# Patient Record
Sex: Female | Born: 1955 | Race: Black or African American | Hispanic: No | Marital: Single | Smoking: Never smoker
Health system: Southern US, Community
[De-identification: ages and names within clinical notes are randomized; demographics above are authoritative.]

## PROBLEM LIST (undated history)

## (undated) DIAGNOSIS — H3320 Serous retinal detachment, unspecified eye: Secondary | ICD-10-CM

## (undated) DIAGNOSIS — H547 Unspecified visual loss: Secondary | ICD-10-CM

## (undated) DIAGNOSIS — K529 Noninfective gastroenteritis and colitis, unspecified: Secondary | ICD-10-CM

## (undated) DIAGNOSIS — I10 Essential (primary) hypertension: Secondary | ICD-10-CM

## (undated) DIAGNOSIS — D649 Anemia, unspecified: Secondary | ICD-10-CM

## (undated) HISTORY — PX: ABDOMINAL HYSTERECTOMY: SHX81

## (undated) HISTORY — PX: EYE SURGERY: SHX253

---

## 2003-09-17 ENCOUNTER — Other Ambulatory Visit: Payer: Self-pay

## 2004-01-30 ENCOUNTER — Ambulatory Visit: Payer: Self-pay | Admitting: Internal Medicine

## 2004-12-23 ENCOUNTER — Emergency Department: Payer: Self-pay | Admitting: Emergency Medicine

## 2005-05-08 ENCOUNTER — Ambulatory Visit: Payer: Self-pay | Admitting: Gastroenterology

## 2005-06-10 ENCOUNTER — Ambulatory Visit: Payer: Self-pay | Admitting: Internal Medicine

## 2006-10-03 ENCOUNTER — Inpatient Hospital Stay: Payer: Self-pay | Admitting: Internal Medicine

## 2006-11-05 ENCOUNTER — Ambulatory Visit: Payer: Self-pay | Admitting: Obstetrics and Gynecology

## 2006-11-12 ENCOUNTER — Inpatient Hospital Stay: Payer: Self-pay | Admitting: Obstetrics and Gynecology

## 2007-01-11 ENCOUNTER — Ambulatory Visit: Payer: Self-pay | Admitting: Urology

## 2007-02-12 ENCOUNTER — Ambulatory Visit: Payer: Self-pay | Admitting: Urology

## 2007-08-11 ENCOUNTER — Ambulatory Visit: Payer: Self-pay | Admitting: Internal Medicine

## 2008-07-12 ENCOUNTER — Ambulatory Visit: Payer: Self-pay | Admitting: Internal Medicine

## 2009-08-02 ENCOUNTER — Ambulatory Visit: Payer: Self-pay | Admitting: Internal Medicine

## 2010-03-04 ENCOUNTER — Observation Stay: Payer: Self-pay | Admitting: Internal Medicine

## 2010-11-14 ENCOUNTER — Emergency Department (HOSPITAL_COMMUNITY)
Admission: EM | Admit: 2010-11-14 | Discharge: 2010-11-14 | Disposition: A | Discharge status: Home / Self Care | Payer: Medicare Other | Attending: Emergency Medicine | Admitting: Emergency Medicine

## 2010-11-14 DIAGNOSIS — R002 Palpitations: Secondary | ICD-10-CM | POA: Insufficient documentation

## 2010-11-14 DIAGNOSIS — K625 Hemorrhage of anus and rectum: Secondary | ICD-10-CM | POA: Insufficient documentation

## 2010-11-14 DIAGNOSIS — R000 Tachycardia, unspecified: Secondary | ICD-10-CM | POA: Insufficient documentation

## 2010-11-14 DIAGNOSIS — I10 Essential (primary) hypertension: Secondary | ICD-10-CM | POA: Insufficient documentation

## 2010-11-14 DIAGNOSIS — R209 Unspecified disturbances of skin sensation: Secondary | ICD-10-CM | POA: Insufficient documentation

## 2010-11-14 DIAGNOSIS — Z79899 Other long term (current) drug therapy: Secondary | ICD-10-CM | POA: Insufficient documentation

## 2010-11-14 LAB — CBC
HCT: 39 % (ref 36.0–46.0)
Hemoglobin: 12.7 g/dL (ref 12.0–15.0)
RBC: 4.9 MIL/uL (ref 3.87–5.11)
RDW: 14 % (ref 11.5–15.5)
WBC: 7.4 10*3/uL (ref 4.0–10.5)

## 2010-11-14 LAB — BASIC METABOLIC PANEL
BUN: 14 mg/dL (ref 6–23)
CO2: 26 mEq/L (ref 19–32)
Chloride: 108 mEq/L (ref 96–112)
GFR calc Af Amer: >60 mL/min (ref >60)
Potassium: 3.9 mEq/L (ref 3.5–5.1)

## 2010-11-14 LAB — OCCULT BLOOD, POC DEVICE: Fecal Occult Bld: NEGATIVE

## 2011-06-04 ENCOUNTER — Ambulatory Visit: Payer: Self-pay | Admitting: Internal Medicine

## 2011-07-02 DIAGNOSIS — E876 Hypokalemia: Secondary | ICD-10-CM | POA: Insufficient documentation

## 2011-07-02 DIAGNOSIS — R079 Chest pain, unspecified: Secondary | ICD-10-CM | POA: Insufficient documentation

## 2011-12-06 ENCOUNTER — Emergency Department: Payer: Self-pay | Admitting: Emergency Medicine

## 2012-05-20 DIAGNOSIS — H9201 Otalgia, right ear: Secondary | ICD-10-CM | POA: Insufficient documentation

## 2014-05-31 ENCOUNTER — Ambulatory Visit: Payer: Self-pay | Admitting: Gastroenterology

## 2014-06-03 ENCOUNTER — Emergency Department: Payer: Self-pay | Admitting: Emergency Medicine

## 2014-07-24 LAB — SURGICAL PATHOLOGY

## 2014-08-31 ENCOUNTER — Other Ambulatory Visit: Payer: Self-pay | Admitting: Occupational Medicine

## 2014-08-31 ENCOUNTER — Ambulatory Visit: Payer: Self-pay

## 2014-08-31 DIAGNOSIS — M25511 Pain in right shoulder: Secondary | ICD-10-CM

## 2014-08-31 DIAGNOSIS — M79601 Pain in right arm: Secondary | ICD-10-CM

## 2014-09-15 DIAGNOSIS — K51911 Ulcerative colitis, unspecified with rectal bleeding: Secondary | ICD-10-CM | POA: Insufficient documentation

## 2014-09-15 DIAGNOSIS — H5440 Blindness, one eye, unspecified eye: Secondary | ICD-10-CM | POA: Insufficient documentation

## 2014-09-15 DIAGNOSIS — I10 Essential (primary) hypertension: Secondary | ICD-10-CM | POA: Insufficient documentation

## 2014-09-23 DIAGNOSIS — D500 Iron deficiency anemia secondary to blood loss (chronic): Secondary | ICD-10-CM | POA: Insufficient documentation

## 2014-10-01 ENCOUNTER — Encounter: Payer: Self-pay | Admitting: Emergency Medicine

## 2014-10-01 ENCOUNTER — Emergency Department
Admission: EM | Admit: 2014-10-01 | Discharge: 2014-10-01 | Disposition: A | Discharge status: Home / Self Care | Payer: PPO | Attending: Emergency Medicine | Admitting: Emergency Medicine

## 2014-10-01 ENCOUNTER — Emergency Department: Payer: PPO

## 2014-10-01 DIAGNOSIS — S40011A Contusion of right shoulder, initial encounter: Secondary | ICD-10-CM | POA: Insufficient documentation

## 2014-10-01 DIAGNOSIS — H547 Unspecified visual loss: Secondary | ICD-10-CM | POA: Diagnosis not present

## 2014-10-01 DIAGNOSIS — Y9389 Activity, other specified: Secondary | ICD-10-CM | POA: Insufficient documentation

## 2014-10-01 DIAGNOSIS — Y92410 Unspecified street and highway as the place of occurrence of the external cause: Secondary | ICD-10-CM | POA: Insufficient documentation

## 2014-10-01 DIAGNOSIS — S4991XA Unspecified injury of right shoulder and upper arm, initial encounter: Secondary | ICD-10-CM | POA: Diagnosis present

## 2014-10-01 DIAGNOSIS — I10 Essential (primary) hypertension: Secondary | ICD-10-CM | POA: Insufficient documentation

## 2014-10-01 DIAGNOSIS — Y998 Other external cause status: Secondary | ICD-10-CM | POA: Insufficient documentation

## 2014-10-01 HISTORY — DX: Essential (primary) hypertension: I10

## 2014-10-01 HISTORY — DX: Noninfective gastroenteritis and colitis, unspecified: K52.9

## 2014-10-01 HISTORY — DX: Unspecified visual loss: H54.7

## 2014-10-01 HISTORY — DX: Serous retinal detachment, unspecified eye: H33.20

## 2014-10-01 IMAGING — CR DG SHOULDER 2+V*R*
1 series · 3 of 3 positions shown · non-contrast
Comparison: None.

CLINICAL DATA: Status post fall yesterday with right shoulder pain.

EXAM:
RIGHT SHOULDER - 2+ VIEW

[Series 1: dg shoulder right · 0.14mm/px · 3 of 3 slices shown]
[im 1/3]
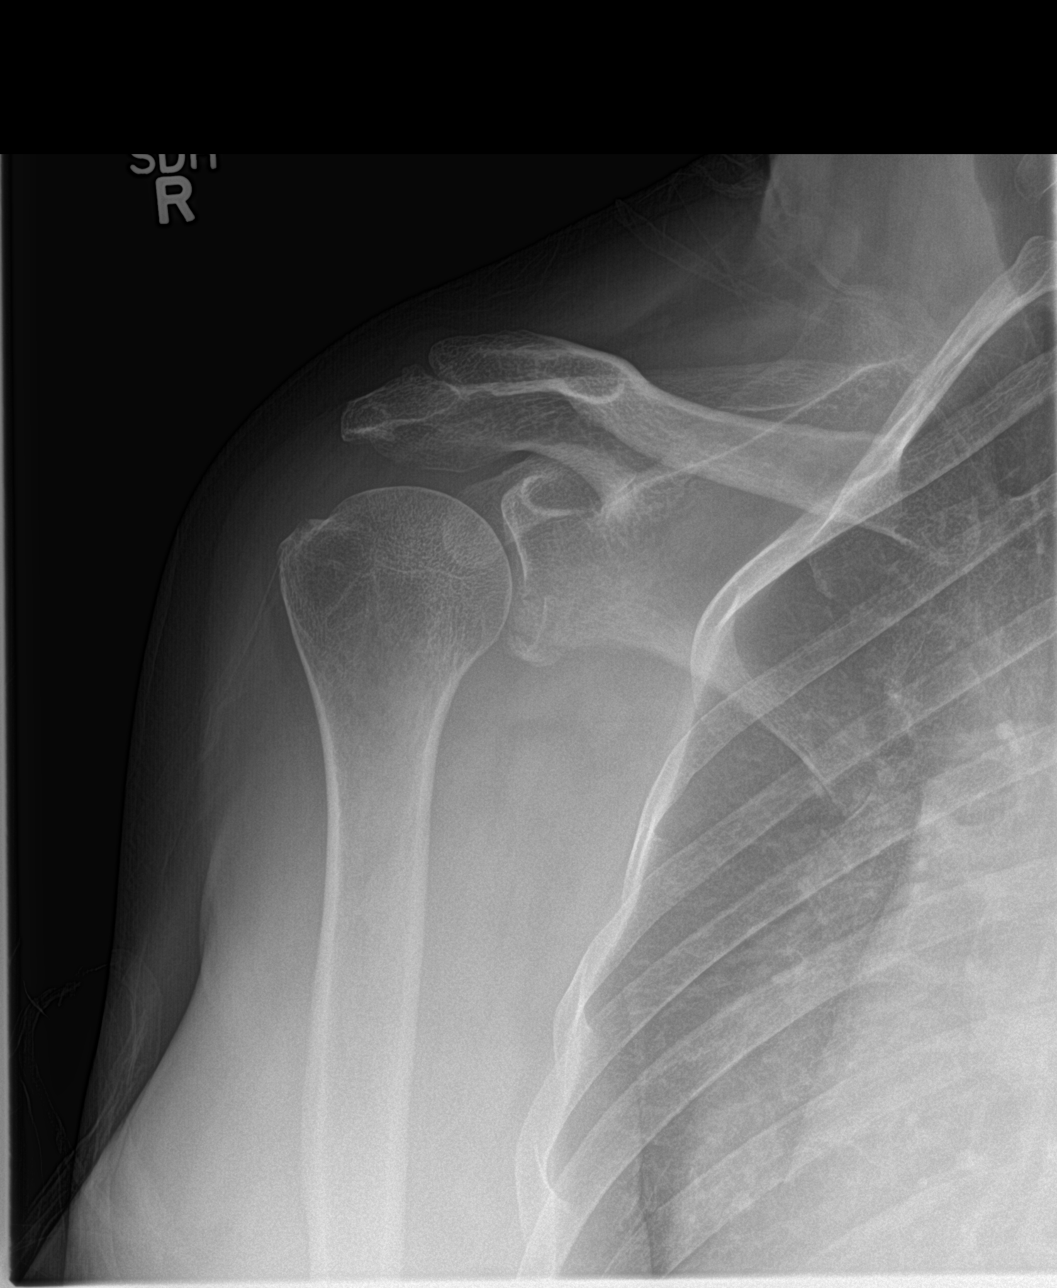
[im 2/3]
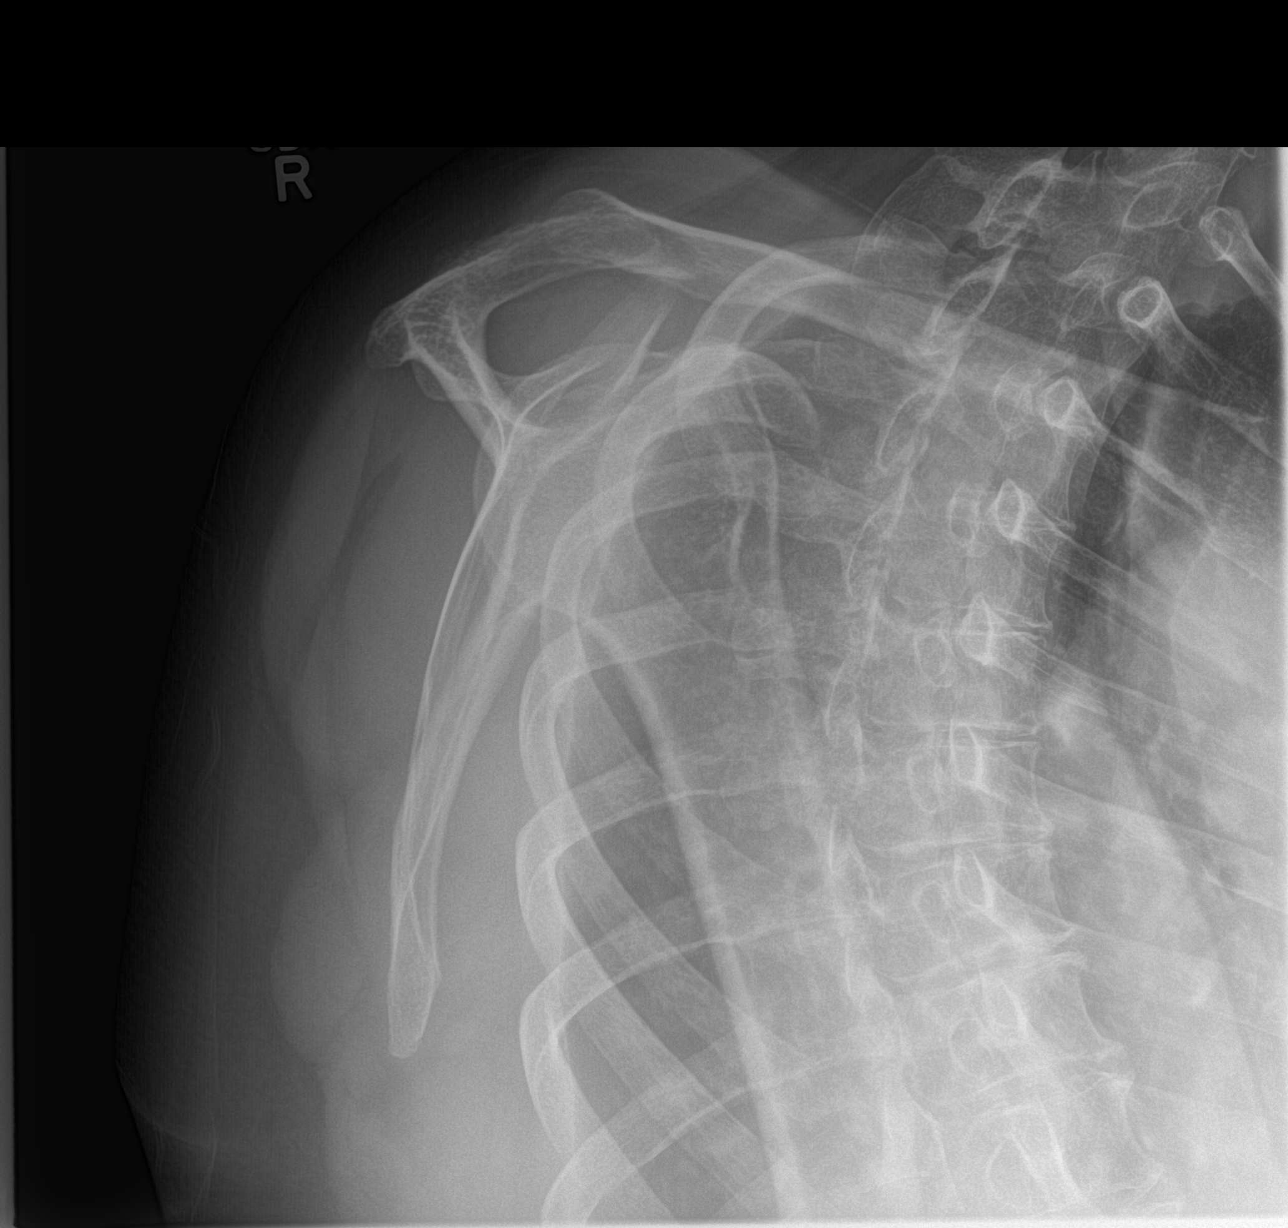
[im 3/3]
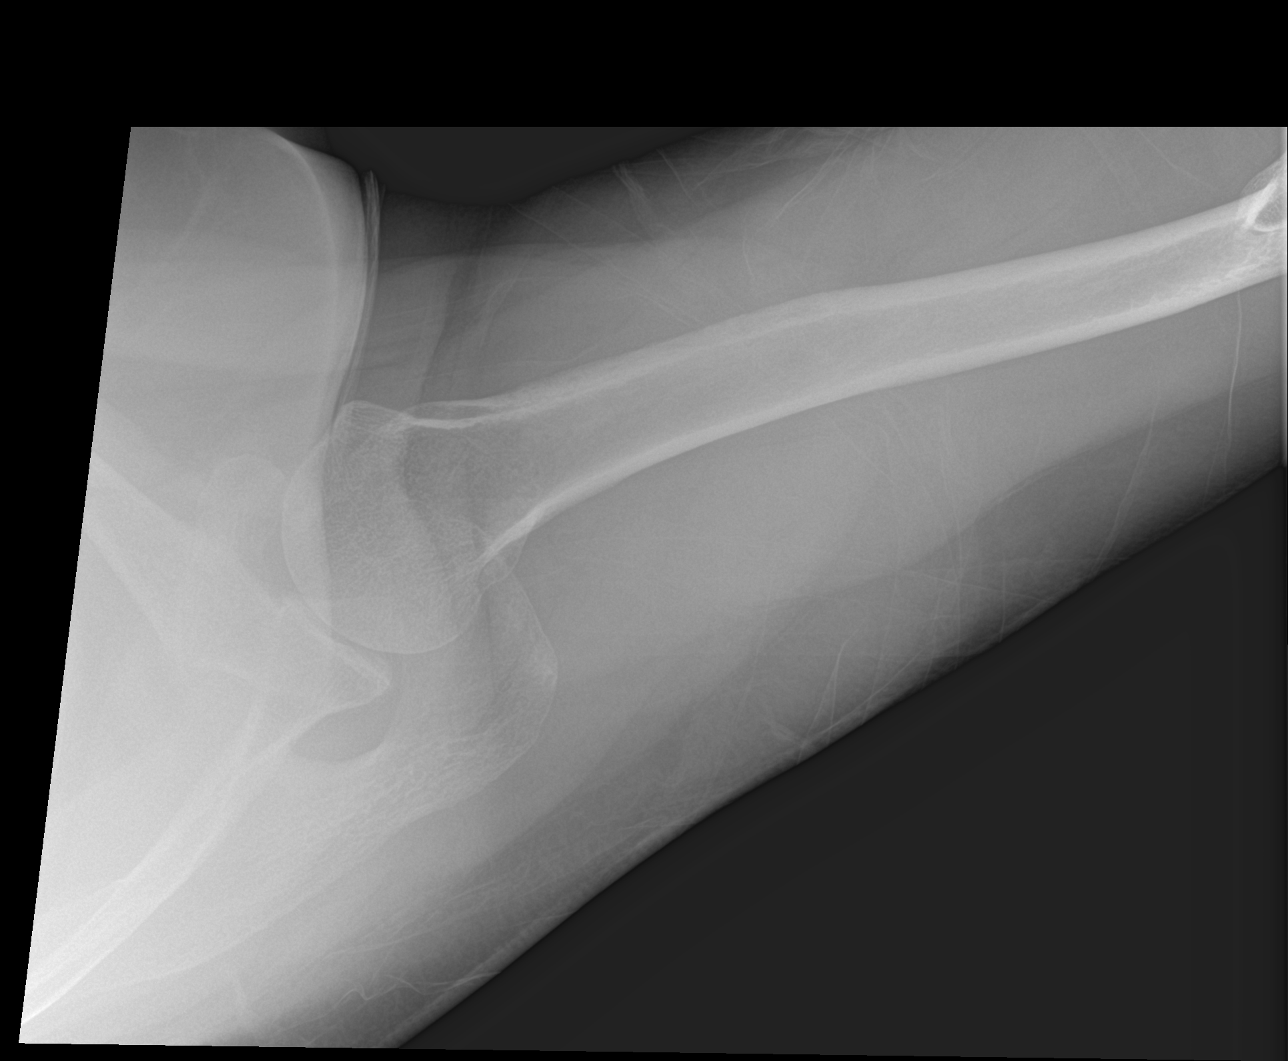

[3 of 3 positions shown; findings below may reference images not displayed]

FINDINGS: There is no evidence of fracture or dislocation. There is no
evidence of arthropathy or other focal bone abnormality. Soft
tissues are unremarkable.
IMPRESSION: Negative.

## 2014-10-01 MED ORDER — HYDROCODONE-ACETAMINOPHEN 5-325 MG PO TABS
1.0000 | ORAL_TABLET | Freq: Once | ORAL | Status: AC
  Administered 2014-10-01: 1 via ORAL

## 2014-10-01 MED ORDER — HYDROCODONE-ACETAMINOPHEN 5-325 MG PO TABS: 1.0000 | ORAL_TABLET | ORAL | Status: DC | PRN

## 2014-10-01 MED ORDER — HYDROCODONE-ACETAMINOPHEN 5-325 MG PO TABS
ORAL_TABLET | ORAL | Status: AC
  Administered 2014-10-01: 1 via ORAL
  Filled 2014-10-01: qty 1

## 2014-10-01 NOTE — Discharge Instructions (Signed)
Contusion A contusion is a deep bruise. Contusions happen when an injury causes bleeding under the skin. Signs of bruising include pain, puffiness (swelling), and discolored skin. The contusion may turn blue, purple, or yellow. HOME CARE   Put ice on the injured area.  Put ice in a plastic bag.  Place a towel between your skin and the bag.  Leave the ice on for 15-20 minutes, 03-04 times a day.  Only take medicine as told by your doctor.  Rest the injured area.  If possible, raise (elevate) the injured area to lessen puffiness. GET HELP RIGHT AWAY IF:   You have more bruising or puffiness.  You have pain that is getting worse.  Your puffiness or pain is not helped by medicine. MAKE SURE YOU:   Understand these instructions.  Will watch your condition.  Will get help right away if you are not doing well or get worse. Document Released: 09/03/2007 Document Revised: 06/09/2011 Document Reviewed: 01/20/2011 [facility] Patient Information 2015 [place], [place]. This information is not intended to replace advice given to you by your health care provider. Make sure you discuss any questions you have with your health care provider.   WEAR SLING WHEN UP WALKING NORCO FOR PAIN AS DIRECTED

## 2014-10-01 NOTE — ED Notes (Addendum)
Pt reports she fell getting out of the [place] yesterday. Pt states she is visually impaired and her caregiver states she stepped down wrong. Pain to upper right shoulder. Increased pain to shoulder with movement.

## 2014-10-01 NOTE — ED Provider Notes (Signed)
[facility] Emergency Department Provider Note  ____________________________________________  Time seen: 1647  I have reviewed the triage vital signs and the nursing notes.   HISTORY  Chief Complaint Shoulder Injury   HPI Tiffany Rosales is a 59 y.o. female is here with complaint of right shoulder pain. Patient states that she fell out of the [place] yesterday because she is visually impaired and stepped down wrong. There is increased pain with movement of the shoulder however patient was able to get dressed this morning by herself. She denies any previous problems with her shoulder. Currently patient's pain is moderate being between 5 and 6 out of 10. Pain has been constant and she fell. Holding it still makes it feel better, movement makes it worse.   Past Medical History  Diagnosis Date  . Vision impairment   . Colitis   . Hypertension   . Detached retina     There are no active problems to display for this patient.   Past Surgical History  Procedure Laterality Date  . Abdominal hysterectomy      Current Outpatient Rx  Name  Route  Sig  Dispense  Refill  . HYDROcodone-acetaminophen (NORCO/VICODIN) 5-325 MG per tablet   Oral   Take 1 tablet by mouth every 4 (four) hours as needed for moderate pain.   20 tablet   0     Allergies Review of patient's allergies indicates no known allergies.  No family history on file.  Social History History  Substance Use Topics  . Smoking status: Never Smoker   . Smokeless tobacco: Not on file  . Alcohol Use: No    Review of Systems Constitutional: No fever/chills Eyes: No acute visual changes. Patient is visually impaired ENT: No sore throat. Cardiovascular: Denies chest pain. Respiratory: Denies shortness of breath. Gastrointestinal: No abdominal pain.  No nausea, no vomiting.  Genitourinary: Negative for dysuria. Musculoskeletal: Negative for back pain. Skin: Negative for  rash. Neurological: Negative for headaches, focal weakness or numbness.  10-point ROS otherwise negative.  ____________________________________________   PHYSICAL EXAM:  VITAL SIGNS: ED Triage Vitals  Enc Vitals Group     BP 10/01/14 1555 134/84 mmHg     Pulse Rate 10/01/14 1555 106     Resp 10/01/14 1555 18     Temp 10/01/14 1555 97.4 F (36.3 C)     Temp Source 10/01/14 1555 Oral     SpO2 10/01/14 1555 99 %     Weight 10/01/14 1555 250 lb (113.399 kg)     Height 10/01/14 1555 5' 7"  (1.702 m)     Head Cir --      Peak Flow --      Pain Score --      Pain Loc --      Pain Edu? --      Excl. in [place]? --     Constitutional: Alert and oriented. Well appearing and in no acute distress. Eyes: Conjunctivae are normal. Head: Atraumatic. Nose: No congestion/rhinnorhea. Neck: No stridor.   Cardiovascular: Normal rate, regular rhythm. Grossly normal heart sounds.  Good peripheral circulation. Respiratory: Normal respiratory effort.  No retractions. Lungs CTAB. Gastrointestinal: Soft and nontender. No distention. Musculoskeletal: Right shoulder no gross deformity. Range of motion is restricted secondary to discomfort. No crepitus was noted. No abrasion or ecchymosis noted in the area. Motor sensory function intact   No lower extremity tenderness nor edema.  No joint effusions. Neurologic:  Normal speech and language. No gross focal neurologic deficits  are appreciated. Speech is normal. No gait instability. Skin:  Skin is warm, dry. See shoulder exam for skin. Psychiatric: Mood and affect are normal. Speech and behavior are normal.  ____________________________________________   LABS (all labs ordered are listed, but only abnormal results are displayed)  Labs Reviewed - No data to display ____________________________________________  EKG  Deferred ____________________________________________  RADIOLOGY  No evidence of fracture or dislocation per radiologist and reviewed by  me I, Johnn Hai, personally viewed and evaluated these images as part of my medical decision making.  ____________________________________________   PROCEDURES  Procedure(s) performed: None  Critical Care performed: No  ____________________________________________   INITIAL IMPRESSION / ASSESSMENT AND PLAN / ED COURSE  Pertinent labs & imaging results that were available during my care of the patient were reviewed by me and considered in my medical decision making (see chart for details  patient agrees to wear a sling to help support her shoulder for a couple days. She is also given a prescription for Norco for pain. She is to follow-up with Dr.  Mack Guise if any continued problems.   FINAL CLINICAL IMPRESSION(S) / ED DIAGNOSES  Final diagnoses:  Contusion of right shoulder, initial encounter      Johnn Hai, PA-C 10/01/14 [place], MD 10/02/14 [phone]

## 2014-10-03 ENCOUNTER — Ambulatory Visit: Payer: Self-pay | Admitting: Family Medicine

## 2015-02-06 ENCOUNTER — Encounter
Admission: RE | Admit: 2015-02-06 | Discharge: 2015-02-06 | Disposition: A | Discharge status: Home / Self Care | Payer: PPO | Source: Ambulatory Visit | Attending: Gastroenterology | Admitting: Gastroenterology

## 2015-02-06 DIAGNOSIS — K5090 Crohn's disease, unspecified, without complications: Secondary | ICD-10-CM | POA: Diagnosis not present

## 2015-02-06 LAB — CBC
HCT: 37.4 % (ref 35.0–47.0)
Hemoglobin: 11.3 g/dL — ABNORMAL LOW (ref 12.0–16.0)
MCH: 23.2 pg — AB (ref 26.0–34.0)
MCHC: 30.3 g/dL — ABNORMAL LOW (ref 32.0–36.0)
MCV: 76.5 fL — AB (ref 80.0–100.0)
PLATELETS: 195 10*3/uL (ref 150–440)
RBC: 4.89 MIL/uL (ref 3.80–5.20)
RDW: 18.1 % — AB (ref 11.5–14.5)
WBC: 9.3 10*3/uL (ref 3.6–11.0)

## 2015-02-06 LAB — ALT: ALT: 26 U/L (ref 14–54)

## 2015-03-06 ENCOUNTER — Encounter
Admission: RE | Admit: 2015-03-06 | Discharge: 2015-03-06 | Disposition: A | Discharge status: Home / Self Care | Payer: PPO | Source: Ambulatory Visit | Attending: Family Medicine | Admitting: Family Medicine

## 2015-03-06 DIAGNOSIS — K5090 Crohn's disease, unspecified, without complications: Secondary | ICD-10-CM | POA: Insufficient documentation

## 2015-03-06 LAB — CBC
HCT: 36.7 % (ref 35.0–47.0)
HEMOGLOBIN: 11.6 g/dL — AB (ref 12.0–16.0)
MCH: 25.7 pg — AB (ref 26.0–34.0)
MCHC: 31.5 g/dL — AB (ref 32.0–36.0)
MCV: 81.3 fL (ref 80.0–100.0)
PLATELETS: 266 10*3/uL (ref 150–440)
RBC: 4.51 MIL/uL (ref 3.80–5.20)
RDW: 18.8 % — AB (ref 11.5–14.5)
WBC: 5.4 10*3/uL (ref 3.6–11.0)

## 2015-03-06 LAB — ALT: ALT: 45 U/L (ref 14–54)

## 2015-04-17 DIAGNOSIS — Z9181 History of falling: Secondary | ICD-10-CM | POA: Insufficient documentation

## 2015-05-01 ENCOUNTER — Other Ambulatory Visit
Admission: RE | Admit: 2015-05-01 | Discharge: 2015-05-01 | Disposition: A | Discharge status: Home / Self Care | Payer: Medicare Other | Source: Ambulatory Visit | Attending: Gastroenterology | Admitting: Gastroenterology

## 2015-05-01 DIAGNOSIS — K51911 Ulcerative colitis, unspecified with rectal bleeding: Secondary | ICD-10-CM | POA: Insufficient documentation

## 2015-05-01 LAB — CBC
HEMATOCRIT: 38.8 % (ref 35.0–47.0)
Hemoglobin: 12.3 g/dL (ref 12.0–16.0)
MCH: 26.5 pg (ref 26.0–34.0)
MCHC: 31.8 g/dL — AB (ref 32.0–36.0)
MCV: 83.6 fL (ref 80.0–100.0)
Platelets: 177 10*3/uL (ref 150–440)
RBC: 4.64 MIL/uL (ref 3.80–5.20)
RDW: 19.5 % — AB (ref 11.5–14.5)
WBC: 13.3 10*3/uL — ABNORMAL HIGH (ref 3.6–11.0)

## 2015-05-01 LAB — ALT: ALT: 27 U/L (ref 14–54)

## 2015-05-15 ENCOUNTER — Other Ambulatory Visit
Admission: RE | Admit: 2015-05-15 | Discharge: 2015-05-15 | Disposition: A | Discharge status: Home / Self Care | Payer: Medicare Other | Source: Ambulatory Visit | Attending: Gastroenterology | Admitting: Gastroenterology

## 2015-05-15 DIAGNOSIS — K5090 Crohn's disease, unspecified, without complications: Secondary | ICD-10-CM | POA: Insufficient documentation

## 2015-05-15 LAB — CBC
HEMATOCRIT: 37.9 % (ref 35.0–47.0)
HEMOGLOBIN: 12.1 g/dL (ref 12.0–16.0)
MCH: 26.8 pg (ref 26.0–34.0)
MCHC: 31.8 g/dL — ABNORMAL LOW (ref 32.0–36.0)
MCV: 84.1 fL (ref 80.0–100.0)
Platelets: 144 10*3/uL — ABNORMAL LOW (ref 150–440)
RBC: 4.5 MIL/uL (ref 3.80–5.20)
RDW: 18 % — ABNORMAL HIGH (ref 11.5–14.5)
WBC: 6.8 10*3/uL (ref 3.6–11.0)

## 2015-05-15 LAB — ALT: ALT: 20 U/L (ref 14–54)

## 2015-06-16 ENCOUNTER — Emergency Department: Payer: Medicare Other

## 2015-06-16 ENCOUNTER — Emergency Department
Admission: EM | Admit: 2015-06-16 | Discharge: 2015-06-16 | Disposition: A | Discharge status: Home / Self Care | Payer: Medicare Other | Attending: Emergency Medicine | Admitting: Emergency Medicine

## 2015-06-16 DIAGNOSIS — I480 Paroxysmal atrial fibrillation: Secondary | ICD-10-CM

## 2015-06-16 DIAGNOSIS — I4891 Unspecified atrial fibrillation: Secondary | ICD-10-CM | POA: Insufficient documentation

## 2015-06-16 DIAGNOSIS — N390 Urinary tract infection, site not specified: Secondary | ICD-10-CM | POA: Diagnosis not present

## 2015-06-16 DIAGNOSIS — I10 Essential (primary) hypertension: Secondary | ICD-10-CM | POA: Insufficient documentation

## 2015-06-16 DIAGNOSIS — R42 Dizziness and giddiness: Secondary | ICD-10-CM

## 2015-06-16 LAB — URINALYSIS COMPLETE WITH MICROSCOPIC (ARMC ONLY)
Bilirubin Urine: NEGATIVE
GLUCOSE, UA: NEGATIVE mg/dL
Hgb urine dipstick: NEGATIVE
KETONES UR: NEGATIVE mg/dL
Nitrite: NEGATIVE
PROTEIN: NEGATIVE mg/dL
Specific Gravity, Urine: 1.016 (ref 1.005–1.030)
pH: 7 (ref 5.0–8.0)

## 2015-06-16 LAB — BASIC METABOLIC PANEL
Anion gap: 3 — ABNORMAL LOW (ref 5–15)
BUN: 12 mg/dL (ref 6–20)
CALCIUM: 10.3 mg/dL (ref 8.9–10.3)
CO2: 26 mmol/L (ref 22–32)
CREATININE: 0.75 mg/dL (ref 0.44–1.00)
Chloride: 109 mmol/L (ref 101–111)
GFR calc Af Amer: >60 mL/min (ref >60)
GLUCOSE: 73 mg/dL (ref 65–99)
POTASSIUM: 3.9 mmol/L (ref 3.5–5.1)
SODIUM: 138 mmol/L (ref 135–145)

## 2015-06-16 LAB — CBC
HEMATOCRIT: 36.2 % (ref 35.0–47.0)
Hemoglobin: 11.7 g/dL — ABNORMAL LOW (ref 12.0–16.0)
MCH: 27.5 pg (ref 26.0–34.0)
MCHC: 32.3 g/dL (ref 32.0–36.0)
MCV: 85.4 fL (ref 80.0–100.0)
PLATELETS: 172 10*3/uL (ref 150–440)
RBC: 4.24 MIL/uL (ref 3.80–5.20)
RDW: 15.2 % — AB (ref 11.5–14.5)
WBC: 5.6 10*3/uL (ref 3.6–11.0)

## 2015-06-16 LAB — TROPONIN I: Troponin I: <0.03 ng/mL (ref <0.031)

## 2015-06-16 MED ORDER — CEPHALEXIN 500 MG PO CAPS: 500.0000 mg | ORAL_CAPSULE | Freq: Two times a day (BID) | ORAL | Status: DC

## 2015-06-16 MED ORDER — DEXTROSE 5 % IV SOLN
1.0000 g | Freq: Once | INTRAVENOUS | Status: AC
  Administered 2015-06-16: 1 g via INTRAVENOUS

## 2015-06-16 MED ORDER — SODIUM CHLORIDE 0.9 % IV BOLUS (SEPSIS)
1000.0000 mL | Freq: Once | INTRAVENOUS | Status: AC
  Administered 2015-06-16: 1000 mL via INTRAVENOUS

## 2015-06-16 MED ORDER — DEXTROSE 5 % IV SOLN
INTRAVENOUS | Status: AC
  Administered 2015-06-16: 1 g via INTRAVENOUS
  Filled 2015-06-16: qty 10

## 2015-06-16 NOTE — Discharge Instructions (Signed)
You were evaluated for dizziness, and although no certain cause was found, you were found to have a urinary tract infection and are being treated with IV antibiotics. He was given a 24-hour antibiotic, Rocephin in the emergency. He can start Keflex tomorrow.  Follow-up with her primary care doctor this week. Return to emergency department for any new or worsening condition, any fever, confusion, new weakness or numbness, dizziness or passing, or any other symptoms concerning to you.   Dizziness Dizziness is a common problem. It is a feeling of unsteadiness or light-headedness. You may feel like you are about to faint. Dizziness can lead to injury if you stumble or fall. Anyone can become dizzy, but dizziness is more common in older adults. This condition can be caused by a number of things, including medicines, dehydration, or illness. HOME CARE INSTRUCTIONS Taking these steps may help with your condition: Eating and Drinking  Drink enough fluid to keep your urine clear or pale yellow. This helps to keep you from becoming dehydrated. Try to drink more clear fluids, such as water.  Do not drink alcohol.  Limit your caffeine intake if directed by your health care provider.  Limit your salt intake if directed by your health care provider. Activity  Avoid making quick movements.  Rise slowly from chairs and steady yourself until you feel okay.  In the morning, first sit up on the side of the bed. When you feel okay, stand slowly while you hold onto something until you know that your balance is fine.  Move your legs often if you need to stand in one place for a long time. Tighten and relax your muscles in your legs while you are standing.  Do not drive or operate heavy machinery if you feel dizzy.  Avoid bending down if you feel dizzy. Place items in your home so that they are easy for you to reach without leaning over. Lifestyle  Do not use any tobacco products, including cigarettes,  chewing tobacco, or electronic cigarettes. If you need help quitting, ask your health care provider.  Try to reduce your stress level, such as with yoga or meditation. Talk with your health care provider if you need help. General Instructions  Watch your dizziness for any changes.  Take medicines only as directed by your health care provider. Talk with your health care provider if you think that your dizziness is caused by a medicine that you are taking.  Tell a friend or a family member that you are feeling dizzy. If he or she notices any changes in your behavior, have this person call your health care provider.  Keep all follow-up visits as directed by your health care provider. This is important. SEEK MEDICAL CARE IF:  Your dizziness does not go away.  Your dizziness or light-headedness gets worse.  You feel nauseous.  You have reduced hearing.  You have new symptoms.  You are unsteady on your feet or you feel like the room is spinning. SEEK IMMEDIATE MEDICAL CARE IF:  You vomit or have diarrhea and are unable to eat or drink anything.  You have problems talking, walking, swallowing, or using your arms, hands, or legs.  You feel generally weak.  You are not thinking clearly or you have trouble forming sentences. It may take a friend or family member to notice this.  You have chest pain, abdominal pain, shortness of breath, or sweating.  Your vision changes.  You notice any bleeding.  You have a headache.  You have neck pain or a stiff neck.  You have a fever.   This information is not intended to replace advice given to you by your health care provider. Make sure you discuss any questions you have with your health care provider.   Document Released: 09/10/2000 Document Revised: 08/01/2014 Document Reviewed: 03/13/2014 Elsevier Interactive Patient Education 2016 [employer]  Urinary Tract Infection Urinary tract infections (UTIs) can develop anywhere along  your urinary tract. Your urinary tract is your body's drainage system for removing wastes and extra water. Your urinary tract includes two kidneys, two ureters, a bladder, and a urethra. Your kidneys are a pair of bean-shaped organs. Each kidney is about the size of your fist. They are located below your ribs, one on each side of your spine. CAUSES Infections are caused by microbes, which are microscopic organisms, including fungi, viruses, and bacteria. These organisms are so small that they can only be seen through a microscope. Bacteria are the microbes that most commonly cause UTIs. SYMPTOMS  Symptoms of UTIs may vary by age and gender of the patient and by the location of the infection. Symptoms in young women typically include a frequent and intense urge to urinate and a painful, burning feeling in the bladder or urethra during urination. Older women and men are more likely to be tired, shaky, and weak and have muscle aches and abdominal pain. A fever may mean the infection is in your kidneys. Other symptoms of a kidney infection include pain in your back or sides below the ribs, nausea, and vomiting. DIAGNOSIS To diagnose a UTI, your caregiver will ask you about your symptoms. Your caregiver will also ask you to provide a urine sample. The urine sample will be tested for bacteria and white blood cells. White blood cells are made by your body to help fight infection. TREATMENT  Typically, UTIs can be treated with medication. Because most UTIs are caused by a bacterial infection, they usually can be treated with the use of antibiotics. The choice of antibiotic and length of treatment depend on your symptoms and the type of bacteria causing your infection. HOME CARE INSTRUCTIONS  If you were prescribed antibiotics, take them exactly as your caregiver instructs you. Finish the medication even if you feel better after you have only taken some of the medication.  Drink enough water and fluids to keep  your urine clear or pale yellow.  Avoid caffeine, tea, and carbonated beverages. They tend to irritate your bladder.  Empty your bladder often. Avoid holding urine for long periods of time.  Empty your bladder before and after sexual intercourse.  After a bowel movement, women should cleanse from front to back. Use each tissue only once. SEEK MEDICAL CARE IF:   You have back pain.  You develop a fever.  Your symptoms do not begin to resolve within 3 days. SEEK IMMEDIATE MEDICAL CARE IF:   You have severe back pain or lower abdominal pain.  You develop chills.  You have nausea or vomiting.  You have continued burning or discomfort with urination. MAKE SURE YOU:   Understand these instructions.  Will watch your condition.  Will get help right away if you are not doing well or get worse.   This information is not intended to replace advice given to you by your health care provider. Make sure you discuss any questions you have with your health care provider.   Document Released: 12/25/2004 Document Revised: 12/06/2014 Document Reviewed: 04/25/2011 Elsevier Interactive Patient Education  2016 [place].

## 2015-06-16 NOTE — ED Provider Notes (Signed)
[facility] Emergency Department Provider Note   ____________________________________________  Time seen:  I have reviewed the triage vital signs and the triage nursing note.  HISTORY  Chief Complaint Dizziness   Historian Patient  HPI Tiffany Rosales is a 60 y.o. female with a history of blindness, is here with a complaint of dizziness which she describes as lightheaded. She started feeling dizzy yesterday. It seems to be worse with change of position, or brought on with change of position. She reports no history of being recently dehydrated. Does not describe room spinning. Does not describe being off balance. No one-sided weakness numbness. No problems with slurred speech or facial droop. Denies urinary symptoms. Denies abdominal pain. Denies chest pain or palpitations. No nausea or vomiting.    Past Medical History  Diagnosis Date  . Vision impairment   . Colitis   . Hypertension   . Detached retina     There are no active problems to display for this patient.   Past Surgical History  Procedure Laterality Date  . Abdominal hysterectomy      Current Outpatient Rx  Name  Route  Sig  Dispense  Refill  . cephALEXin (KEFLEX) 500 MG capsule   Oral   Take 1 capsule (500 mg total) by mouth 2 (two) times daily.   14 capsule   0   . HYDROcodone-acetaminophen (NORCO/VICODIN) 5-325 MG per tablet   Oral   Take 1 tablet by mouth every 4 (four) hours as needed for moderate pain.   20 tablet   0     Allergies Review of patient's allergies indicates no known allergies.  No family history on file.  Social History Social History  Substance Use Topics  . Smoking status: Never Smoker   . Smokeless tobacco: None  . Alcohol Use: No    Review of Systems  Constitutional: Negative for fever. Eyes: Negative for visual changes. ENT: Negative for sore throat. Cardiovascular: Negative for chest pain. Respiratory: Negative for shortness of  breath. Gastrointestinal: Negative for abdominal pain, vomiting and diarrhea. Genitourinary: Negative for dysuria. Musculoskeletal: Negative for back pain. Skin: Negative for rash. Neurological: Negative for headache. 10 point Review of Systems otherwise negative ____________________________________________   PHYSICAL EXAM:  VITAL SIGNS: ED Triage Vitals  Enc Vitals Group     BP 06/16/15 1322 165/99 mmHg     Pulse Rate 06/16/15 1322 110     Resp 06/16/15 1322 12     Temp 06/16/15 1322 98.4 F (36.9 C)     Temp Source 06/16/15 1322 Oral     SpO2 06/16/15 1322 98 %     Weight 06/16/15 1322 260 lb 2.3 oz (118 kg)     Height 06/16/15 1322 5' 7"  (1.702 m)     Head Cir --      Peak Flow --      Pain Score --      Pain Loc --      Pain Edu? --      Excl. in [place]? --      Constitutional: Alert and oriented. Well appearing and in no distress. HEENT   Head: Normocephalic and atraumatic.      Eyes: Roving eye movements, cataracts, poor vision      Ears:         Nose: No congestion/rhinnorhea.   Mouth/Throat: Mucous membranes are moist.   Neck: No stridor. Cardiovascular/Chest: Normal rate, regular rhythm.  No murmurs, rubs, or gallops. Respiratory: Normal respiratory effort without tachypnea  nor retractions. Breath sounds are clear and equal bilaterally. No wheezes/rales/rhonchi. Gastrointestinal: Soft. No distention, no guarding, no rebound. Nontender.   Genitourinary/rectal:Deferred Musculoskeletal: Nontender with normal range of motion in all extremities. No joint effusions.  No lower extremity tenderness.  No edema. Neurologic:  Normal speech and language. 5 out of 5 strength in 4 extremities. No sensory deficit. No facial droop. Full neurologic exam is somewhat limited due to patient's blindness. Skin:  Skin is warm, dry and intact. No rash noted. Psychiatric: Mood and affect are normal. Speech and behavior are normal. Patient exhibits appropriate insight and  judgment.  ____________________________________________   EKG I, Lisa Roca, MD, the attending physician have personally viewed and interpreted all ECGs.  Irregularly irregular, likely atrial fibrillation 83 bpm. Nonspecific intraventricular conduction delay. Normal axis. T-wave inverted laterally.  Rhythm strip from the rim showing normal sinus rhythm in the 90s, up to a sinus tachycardia of about 110.   ____________________________________________  LABS (pertinent positives/negatives)  Urinalysis tuberosity states, 6-30 white blood cells and rare bacteria next line basic metabolic panel within normal limits White blood count 5.6, hemoglobin 11.7 and platelet count 132 Troponin less than 0.03  ____________________________________________  RADIOLOGY All Xrays were viewed by me. Imaging interpreted by Radiologist.  CT head noncontrast: Pending __________________________________________  PROCEDURES  Procedure(s) performed: None  Critical Care performed: None  ____________________________________________   ED COURSE / ASSESSMENT AND PLAN  Pertinent labs & imaging results that were available during my care of the patient were reviewed by me and considered in my medical decision making (see chart for details).    Patient's here with a day and a half of dizziness which she describes more as lightheadedness. No history of similar. No report of recent illness or decreased by mouth intake although her orthostatics do indicate possibly some level of orthostatic dizziness. Patient was given 1 L normal saline. Next the next line on my initial exam patient had a sinus tachycardia viewed on the rhythm strip monitor at about 102 bpm.  When the EKG was printed per the nurse, the EKG is irregularly irregular, possibly atrial fibrillation versus normal sinus rhythm with PACs.  Patient has no history of A. fib. I'm not sure if it's possible that her episodes of dizziness at home could be  A. fib with RVR. Here when she was in A. fib she's actually had a slower heart rate then when she is in sinus rhythm. I discussed this with Dr. Rayann Heman, and he recommended no specific indication for hospitalization. Patient may call the office on Monday to get an appointment Monday or Tuesday. We discussed, not starting any rhythm control/rate control medication at this point time without any known tachycardia with the A. fib.  Of note, when I did go back into reevaluate the patient, her rhythm strip was showing normal sinus rhythm. In terms of neurologic source of dizziness, I think this is unlikely. No focal neurologic complaints or findings on exam, although somewhat limited due to her vision impairment.  CT head was obtained and shows no acute finding. Patient's symptoms have been going on about 24 hours now, and I don't feel that an MRI is warranted based on low suspicion for acute stroke, and duration of symptoms at this point time.  Dr. Dineen Kid to follow-up head CT and likely discharge unless acute findings. Transfer of care 4 PM shift change.   CONSULTATIONS:   Phone discussion with cardiologist, Dr. Rayann Heman.   Patient / Family / Caregiver informed of clinical course,  medical decision-making process, and agree with plan.   I discussed return precautions, follow-up instructions, and discharged instructions with patient and/or family.   ___________________________________________   FINAL CLINICAL IMPRESSION(S) / ED DIAGNOSES   Final diagnoses:  UTI (lower urinary tract infection)  Dizziness  Intermittent atrial fibrillation ([place])              Note: This dictation was prepared with Dragon dictation. Any transcriptional errors that result from this process are unintentional   Lisa Roca, MD 06/16/15 1606

## 2015-06-16 NOTE — ED Notes (Signed)
Pt came via EMS from home. Pt reports working yesterday and feeling dizzy after walking up some stairs. Pt reports feeling better afterwards but then woke up this morning and felt weak and SOB. Pt denies n/v/d. Pt sinus tach upon arrival.

## 2015-06-16 NOTE — ED Notes (Signed)
MD at bedside to followup

## 2015-06-16 NOTE — ED Provider Notes (Signed)
Signout from Dr. Reita Cliche to follow-up with the patient's CAT scan of the brain. Questionable CVA secondary to dizziness. Physical Exam  BP 131/83 mmHg  Pulse 90  Temp(Src) 98.4 F (36.9 C) (Oral)  Resp 28  Ht 5' 7"  (1.702 m)  Wt 260 lb 2.3 oz (118 kg)  BMI 40.73 kg/m2  SpO2 98% ----------------------------------------- 7:43 PM on 06/16/2015 -----------------------------------------     MR Brain Wo Contrast (Final result) Result time: 06/16/15 19:03:33   Final result by Rad Results In Interface (06/16/15 19:03:33)   Narrative:   CLINICAL DATA: Dizziness. Rule out infarct. Headache.  EXAM: MRI HEAD WITHOUT CONTRAST  TECHNIQUE: Multiplanar, multiecho pulse sequences of the brain and surrounding structures were obtained without intravenous contrast.  COMPARISON: CT head 06/16/2015 off  FINDINGS: Negative for acute infarct.  No significant chronic ischemia.  Ventricle size normal. Negative for hydrocephalus. Cerebral volume normal.  Negative for demyelinating disease.  Negative for hemorrhage or mass.  Pituitary normal in size. Mucous retention cyst left maxillary sinus  Scleral band on the left globe appears to have slipped posteriorly. Hyperdensity within the vitreous of the right eye suggesting hemorrhage. This may be chronic.  IMPRESSION: No acute intracranial abnormality. Negative for acute or chronic infarct  Bilateral ocular abnormalities as above.   Electronically Signed By: Franchot Gallo M.D. On: 06/16/2015 19:03          CT Head Wo Contrast (Final result) Result time: 06/16/15 16:07:28   Final result by Rad Results In Interface (06/16/15 16:07:28)   Narrative:   CLINICAL DATA: Dizziness.  EXAM: CT HEAD WITHOUT CONTRAST  TECHNIQUE: Contiguous axial images were obtained from the base of the skull through the vertex without intravenous contrast.  COMPARISON: None.  FINDINGS: Questionable small right inferior cerebellar  hemisphere hypodense focus (series 2/image 5). Otherwise no evidence of parenchymal hemorrhage or extra-axial fluid collection. No mass lesion, mass effect, or midline shift.  No CT evidence of acute transcortical infarction. Cerebral volume is age appropriate. No ventriculomegaly.  The visualized paranasal sinuses are essentially clear. The mastoid air cells are unopacified. No evidence of calvarial fracture. Asymmetrically small and irregularly shaped left globe with curvilinear hyperdensity posteriorly in the globe.  IMPRESSION: 1. Questionable small right inferior cerebellar hemisphere hypodense focus, which could be artifactual, although a focus of ischemia or a mass lesion cannot be excluded. Recommend correlation with brain MRI. 2. Otherwise no acute intracranial abnormality. 3. Asymmetrically small and irregularly-shaped left globe with nonspecific hyperdensity posteriorly in the globe, which could represent postprocedural change, correlate with ophthalmological history.   Electronically Signed By: Ilona Sorrel M.D. On: 06/16/2015 16:07        Physical Exam Patient resting comfortably without any dizziness or lightheadedness at this time. ED Course  Procedures  MDM Patient with originally possible acute finding distant with stroke on the CAT scan but no acute findings on MRI. Likely UTI responsible for symptoms. Discussed this with the patient as well as the family who is at the bedside. She'll be discharged home with antibiotics. They're understanding of the plan and willing to comply.      Orbie Pyo, MD 06/16/15 [phone]

## 2015-06-16 NOTE — ED Notes (Signed)
Patient transported to CT 

## 2015-06-16 NOTE — ED Notes (Signed)
Patient transported to MRI 

## 2015-06-18 LAB — URINE CULTURE

## 2015-06-22 DIAGNOSIS — R1084 Generalized abdominal pain: Secondary | ICD-10-CM

## 2015-06-22 DIAGNOSIS — G8929 Other chronic pain: Secondary | ICD-10-CM | POA: Insufficient documentation

## 2015-08-03 ENCOUNTER — Other Ambulatory Visit
Admission: RE | Admit: 2015-08-03 | Discharge: 2015-08-03 | Disposition: A | Discharge status: Home / Self Care | Payer: Medicare Other | Source: Ambulatory Visit | Attending: Family Medicine | Admitting: Family Medicine

## 2015-08-03 DIAGNOSIS — I10 Essential (primary) hypertension: Secondary | ICD-10-CM | POA: Insufficient documentation

## 2015-08-03 LAB — GASTROINTESTINAL PANEL BY PCR, STOOL (REPLACES STOOL CULTURE)
Adenovirus F40/41: NOT DETECTED
Astrovirus: NOT DETECTED
CAMPYLOBACTER SPECIES: NOT DETECTED
CRYPTOSPORIDIUM: NOT DETECTED
Cyclospora cayetanensis: NOT DETECTED
E. coli O157: NOT DETECTED
ENTEROAGGREGATIVE E COLI (EAEC): NOT DETECTED
ENTEROPATHOGENIC E COLI (EPEC): NOT DETECTED
Entamoeba histolytica: NOT DETECTED
Enterotoxigenic E coli (ETEC): NOT DETECTED
GIARDIA LAMBLIA: NOT DETECTED
NOROVIRUS GI/GII: NOT DETECTED
PLESIMONAS SHIGELLOIDES: NOT DETECTED
ROTAVIRUS A: NOT DETECTED
SALMONELLA SPECIES: NOT DETECTED
SHIGA LIKE TOXIN PRODUCING E COLI (STEC): NOT DETECTED
SHIGELLA/ENTEROINVASIVE E COLI (EIEC): NOT DETECTED
Sapovirus (I, II, IV, and V): NOT DETECTED
Vibrio cholerae: NOT DETECTED
Vibrio species: NOT DETECTED
Yersinia enterocolitica: NOT DETECTED

## 2015-08-03 LAB — C DIFFICILE QUICK SCREEN W PCR REFLEX
C DIFFICILE (CDIFF) INTERP: NEGATIVE
C Diff antigen: NEGATIVE
C Diff toxin: NEGATIVE

## 2015-08-03 LAB — BASIC METABOLIC PANEL
Anion gap: 7 (ref 5–15)
BUN: 12 mg/dL (ref 6–20)
CO2: 24 mmol/L (ref 22–32)
CREATININE: 0.8 mg/dL (ref 0.44–1.00)
Calcium: 10.2 mg/dL (ref 8.9–10.3)
Chloride: 110 mmol/L (ref 101–111)
GFR calc Af Amer: >60 mL/min (ref >60)
GFR calc non Af Amer: >60 mL/min (ref >60)
GLUCOSE: 120 mg/dL — AB (ref 65–99)
Potassium: 3.8 mmol/L (ref 3.5–5.1)
Sodium: 141 mmol/L (ref 135–145)

## 2015-08-03 LAB — CBC
HEMATOCRIT: 40.6 % (ref 35.0–47.0)
HEMOGLOBIN: 13.2 g/dL (ref 12.0–16.0)
MCH: 26.9 pg (ref 26.0–34.0)
MCHC: 32.4 g/dL (ref 32.0–36.0)
MCV: 82.8 fL (ref 80.0–100.0)
PLATELETS: 218 10*3/uL (ref 150–440)
RBC: 4.91 MIL/uL (ref 3.80–5.20)
RDW: 15.6 % — ABNORMAL HIGH (ref 11.5–14.5)
WBC: 13.3 10*3/uL — AB (ref 3.6–11.0)

## 2015-09-10 ENCOUNTER — Emergency Department
Admission: EM | Admit: 2015-09-10 | Discharge: 2015-09-10 | Disposition: A | Discharge status: Home / Self Care | Payer: Medicare Other | Attending: Emergency Medicine | Admitting: Emergency Medicine

## 2015-09-10 ENCOUNTER — Encounter: Payer: Self-pay | Admitting: Emergency Medicine

## 2015-09-10 DIAGNOSIS — I10 Essential (primary) hypertension: Secondary | ICD-10-CM | POA: Diagnosis not present

## 2015-09-10 DIAGNOSIS — B029 Zoster without complications: Secondary | ICD-10-CM | POA: Diagnosis not present

## 2015-09-10 MED ORDER — VALACYCLOVIR HCL 1 G PO TABS: 1000.0000 mg | ORAL_TABLET | Freq: Three times a day (TID) | ORAL | Status: AC

## 2015-09-10 MED ORDER — OXYCODONE-ACETAMINOPHEN 5-325 MG PO TABS: 1.0000 | ORAL_TABLET | Freq: Three times a day (TID) | ORAL | Status: DC | PRN

## 2015-09-10 MED ORDER — VALACYCLOVIR HCL 500 MG PO TABS
1000.0000 mg | ORAL_TABLET | ORAL | Status: AC
  Administered 2015-09-10: 1000 mg via ORAL
  Filled 2015-09-10: qty 2

## 2015-09-10 MED ORDER — OXYCODONE-ACETAMINOPHEN 5-325 MG PO TABS
1.0000 | ORAL_TABLET | Freq: Once | ORAL | Status: AC
  Administered 2015-09-10: 1 via ORAL
  Filled 2015-09-10: qty 1

## 2015-09-10 NOTE — Discharge Instructions (Signed)

## 2015-09-10 NOTE — ED Provider Notes (Signed)
[facility] Emergency Department Provider Note  ____________________________________________  Time seen: 9:00 AM  I have reviewed the triage vital signs and the nursing notes.   HISTORY  Chief Complaint Herpes Zoster    HPI Tiffany Rosales is a 60 y.o. female who complains of a burning painful rash under the left breast and on the left chest wall that she noticed about 5 days ago. She has been taking Humira for one month for ulcerative colitis, started by her doctor at [facility]. She has had a history of zoster approximately 30 years ago. Denies chest pain shortness of breath dizziness headache vision changes. No fevers chills or night sweats. No abdominal pain vomiting or diarrhea. No other rashes.     Past Medical History  Diagnosis Date  . Vision impairment   . Colitis   . Hypertension   . Detached retina      There are no active problems to display for this patient.    Past Surgical History  Procedure Laterality Date  . Abdominal hysterectomy       Current Outpatient Rx  Name  Route  Sig  Dispense  Refill  . cephALEXin (KEFLEX) 500 MG capsule   Oral   Take 1 capsule (500 mg total) by mouth 2 (two) times daily.   14 capsule   0   . HYDROcodone-acetaminophen (NORCO/VICODIN) 5-325 MG per tablet   Oral   Take 1 tablet by mouth every 4 (four) hours as needed for moderate pain.   20 tablet   0   . oxyCODONE-acetaminophen (ROXICET) 5-325 MG tablet   Oral   Take 1 tablet by mouth every 8 (eight) hours as needed for severe pain.   9 tablet   0   . valACYclovir (VALTREX) 1000 MG tablet   Oral   Take 1 tablet (1,000 mg total) by mouth 3 (three) times daily.   30 tablet   0      Allergies Review of patient's allergies indicates no known allergies.   No family history on file.  Social History Social History  Substance Use Topics  . Smoking status: Never Smoker   . Smokeless tobacco: None  . Alcohol Use: No    Review of  Systems  Constitutional:   No fever or chills.  Eyes:   No vision changes.  ENT:   No sore throat. No rhinorrhea. Cardiovascular:   No chest pain. Respiratory:   No dyspnea or cough. Gastrointestinal:   Negative for abdominal pain, vomiting and diarrhea.  Genitourinary:   Negative for dysuria or difficulty urinating. Musculoskeletal:   Negative for focal pain or swelling Neurological:   Negative for headaches 10-point ROS otherwise negative.  ____________________________________________   PHYSICAL EXAM:  VITAL SIGNS: ED Triage Vitals  Enc Vitals Group     BP 09/10/15 0840 159/108 mmHg     Pulse Rate 09/10/15 0840 122     Resp 09/10/15 0840 20     Temp 09/10/15 0840 98.2 F (36.8 C)     Temp Source 09/10/15 0840 Oral     SpO2 09/10/15 0840 96 %     Weight --      Height --      Head Cir --      Peak Flow --      Pain Score 09/10/15 0840 8     Pain Loc --      Pain Edu? --      Excl. in [place]? --     Vital  signs reviewed, nursing assessments reviewed.   Constitutional:   Alert and oriented. Well appearing and in no distress. Eyes:   No scleral icterus. No conjunctival pallor. PERRL. EOMI.  No nystagmus. ENT   Head:   Normocephalic and atraumatic.   Nose:   No congestion/rhinnorhea. No septal hematoma   Mouth/Throat:   MMM, no pharyngeal erythema. No peritonsillar mass.    Neck:   No stridor. No SubQ emphysema. No meningismus. Hematological/Lymphatic/Immunilogical:   No cervical lymphadenopathy. Cardiovascular:   RRR, Heart rate 90. Symmetric bilateral radial and DP pulses.  No murmurs.  Respiratory:   Normal respiratory effort without tachypnea nor retractions. Breath sounds are clear and equal bilaterally. No wheezes/rales/rhonchi. Gastrointestinal:   Soft and nontender. Non distended. There is no CVA tenderness.  No rebound, rigidity, or guarding. Genitourinary:   deferred Musculoskeletal:   Nontender with normal range of motion in all extremities. No  joint effusions.  No lower extremity tenderness.  No edema. Neurologic:   Normal speech and language.  CN 2-10 normal. Motor grossly intact. No gross focal neurologic deficits are appreciated.  Skin:    Skin is warm And dry. There is a vesicular eruption in crops along the T5 dermatomal distribution of the left thorax.  No petechia purpura or bullae. No abscess or focal skin infection findings.  ____________________________________________    LABS (pertinent positives/negatives) (all labs ordered are listed, but only abnormal results are displayed) Labs Reviewed - No data to display ____________________________________________   EKG    ____________________________________________    RADIOLOGY    ____________________________________________   PROCEDURES   ____________________________________________   INITIAL IMPRESSION / ASSESSMENT AND PLAN / ED COURSE  Pertinent labs & imaging results that were available during my care of the patient were reviewed by me and considered in my medical decision making (see chart for details).  Patient well appearing no acute distress. Presents with painful rash on the skin found to have zoster. This is likely due to immunosuppression from her Humira. Does not appear to have disseminated disease, not septic. Very calm and comfortable. I'll start antiviral therapy due to likely immunosuppression, advised her to follow up with her doctor today for advice on whether or not to stop the Humira given they're better experience with the severity of her ulcerative colitis.  Not consistent with Stevens-Johnson or TEN or allergy.     ____________________________________________   FINAL CLINICAL IMPRESSION(S) / ED DIAGNOSES  Final diagnoses:  Herpes zoster       Portions of this note were generated with dragon dictation software. Dictation errors may occur despite best attempts at proofreading.   Carrie Mew, MD 09/10/15 1022

## 2015-09-10 NOTE — ED Notes (Signed)
Pt with possible shingles to under left breast and upper left back area. Pt with hx of same. Pt has just started taking Humara.

## 2015-09-11 ENCOUNTER — Telehealth: Payer: Self-pay | Admitting: Emergency Medicine

## 2015-09-11 NOTE — ED Notes (Signed)
Pt called and says she can't afford the valtrex.  Per dr Corky Downs can change to acyclovir 400 mg 5 times a day for 7 days.  Called in to [facility].

## 2015-10-25 ENCOUNTER — Other Ambulatory Visit: Payer: Self-pay | Admitting: Family Medicine

## 2015-10-25 DIAGNOSIS — Z1231 Encounter for screening mammogram for malignant neoplasm of breast: Secondary | ICD-10-CM

## 2015-11-13 ENCOUNTER — Ambulatory Visit: Payer: Medicare Other

## 2015-11-23 ENCOUNTER — Ambulatory Visit
Admission: RE | Admit: 2015-11-23 | Discharge: 2015-11-23 | Disposition: A | Discharge status: Home / Self Care | Payer: Medicare Other | Source: Ambulatory Visit | Attending: Family Medicine | Admitting: Family Medicine

## 2015-11-23 ENCOUNTER — Other Ambulatory Visit: Payer: Self-pay | Admitting: Family Medicine

## 2015-11-23 DIAGNOSIS — Z1231 Encounter for screening mammogram for malignant neoplasm of breast: Secondary | ICD-10-CM | POA: Diagnosis not present

## 2016-11-04 DIAGNOSIS — M25512 Pain in left shoulder: Secondary | ICD-10-CM | POA: Insufficient documentation

## 2016-11-06 ENCOUNTER — Ambulatory Visit: Payer: Medicare Other | Admitting: Gastroenterology

## 2016-12-08 DIAGNOSIS — Z029 Encounter for administrative examinations, unspecified: Secondary | ICD-10-CM | POA: Insufficient documentation

## 2016-12-31 ENCOUNTER — Ambulatory Visit: Payer: Medicare Other | Admitting: Gastroenterology

## 2017-01-01 ENCOUNTER — Encounter: Payer: Self-pay | Admitting: Gastroenterology

## 2017-01-01 ENCOUNTER — Encounter (INDEPENDENT_AMBULATORY_CARE_PROVIDER_SITE_OTHER): Payer: Self-pay

## 2017-01-01 ENCOUNTER — Ambulatory Visit (INDEPENDENT_AMBULATORY_CARE_PROVIDER_SITE_OTHER): Payer: Medicare Other | Admitting: Gastroenterology

## 2017-01-01 VITALS — BP 115/83 | HR 94 | Temp 98.3°F | Wt 212.0 lb

## 2017-01-01 DIAGNOSIS — K5100 Ulcerative (chronic) pancolitis without complications: Secondary | ICD-10-CM

## 2017-01-01 NOTE — Progress Notes (Signed)
Tiffany Bellows MD, MRCP(U.K) [address]  [place]  [place], [place] [address]  Main: [phone]  Fax: [phone]   Gastroenterology Consultation  Referring Provider:     Charlann Boxer, MD Primary Care Physician:  Charlann Boxer, MD Primary Gastroenterologist:  Dr. Jonathon Rosales  Reason for Consultation:     Transfer of care         HPI:   Tiffany Rosales is a 61 y.o. y/o female referred for consultation & management  by Dr. Charlann Boxer, MD.   She was previously seen at [facility]. H/o ulcerative colitis diagnosed in 05/2014 . Tried treatment with ASA and failed. Then was placed on Imuran but was stopped due to Dizziness and headaches. She was subsequently started on Humira , appears was started around 8/2017Treated with prednisone during flares.  CT abdomen in 05/2014 showed diffuse mild colitis. Colonoscopy 07/2015 - moderately active pan colitis upto hepatic flexure.   Presently taking Humira- every week . She has 1 bowel movement a day , no blood , formed stool. No cramping , no abdominal pain , no joint pains. She takes tylenol. No NSAID's. Does not drink any alcohol.   Has not had a flu shot yet this year. No complaints. +   Past Medical History:  Diagnosis Date  . Colitis   . Detached retina   . Hypertension   . Vision impairment     Past Surgical History:  Procedure Laterality Date  . ABDOMINAL HYSTERECTOMY      Prior to Admission medications   Medication Sig Start Date End Date Taking? Authorizing Provider  Adalimumab (HUMIRA) 40 MG/0.8ML PSKT Inject 40 mg into the skin. 07/18/16  Yes [provider]  ferrous gluconate (FERGON) 324 MG tablet Take 324 mg by mouth. 04/23/16  Yes [provider]  lisinopril (PRINIVIL,ZESTRIL) 10 MG tablet Take 10 mg by mouth. 08/21/16 08/21/17 Yes [provider]  mesalamine (APRISO) 0.375 g 24 hr capsule Take by mouth. 07/04/14  Yes [provider]  naloxone Karma Greaser) 2 MG/2ML injection  Naloxone Intranasal Kit(2  43m/ml 262mpre-filled syringes),#1. Spray 70m54mn each nostril for opiate overdose.  If no resp. in 3mi53mrepeat 05/25/15  Yes [provider]  cephALEXin (KEFLEX) 500 MG capsule Take 1 capsule (500 mg total) by mouth 2 (two) times daily. 06/16/15   LordLisa Roca  HYDROcodone-acetaminophen (NORCO/VICODIN) 5-325 MG per tablet Take 1 tablet by mouth every 4 (four) hours as needed for moderate pain. 10/01/14   SummJohnn Hai-C  lisinopril (PRINIVIL,ZESTRIL) 10 MG tablet  12/21/16   [provider]  metoprolol succinate (TOPROL-XL) 50 MG 24 hr tablet Take by mouth.    [provider]  oxyCODONE-acetaminophen (PERCOCET) 7.5-325 MG tablet  12/05/16   [provider]    No family history on file.   Social History  Substance Use Topics  . Smoking status: Never Smoker  . Smokeless tobacco: Not on file  . Alcohol use No    Allergies as of 01/01/2017  . (No Known Allergies)    Review of Systems:    All systems reviewed and negative except where noted in HPI.   Physical Exam:  There were no vitals taken for this visit. No LMP recorded. Patient has had a hysterectomy. Psych:  Alert and cooperative. Normal mood and affect. General:   Alert,  Well-developed, well-nourished, pleasant and cooperative in NAD Head:  Normocephalic and atraumatic. Eyes: impaired eye sight  Ears:  Normal auditory acuity.  Nose:  No deformity, discharge, or lesions. Mouth:  No deformity or lesions,oropharynx pink & moist. Neck:  Supple; no masses or thyromegaly. Lungs:  Respirations even and unlabored.  Clear throughout to auscultation.   No wheezes, crackles, or rhonchi. No acute distress. Heart:  Regular rate and rhythm; no murmurs, clicks, rubs, or gallops. Abdomen:  Normal bowel sounds.  No bruits.  Soft, non-tender and non-distended without masses, hepatosplenomegaly or hernias noted.  No guarding or rebound tenderness.    Neurologic:  Alert and  oriented x3;  grossly normal neurologically. Skin:  Intact without significant lesions or rashes. No jaundice. Lymph Nodes:  No significant cervical adenopathy. Psych:  Alert and cooperative. Normal mood and affect.  Imaging Studies: No results found.  Assessment and Plan:   Tiffany Rosales is a 61 y.o. y/o female has been referred for transfer of care from her previous GI. Old GI records reviewed and summarized. 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 Clinically she is in remission .   Plan  1. MR enterogram to check for radiological evidence of remission  2. CBC,CMP,CRP,Hep A/B serology,Vitamin D,HIV based on ;labs will address health maintenance next visit.  3. No NSAID's 4. Continue weekly humira 5. If in remission based on above will need colonoscopy to confirm endoscopic remission.   Follow up in 4 weeks   Dr Tiffany Bellows MD,MRCP(U.K)

## 2017-01-01 NOTE — Addendum Note (Signed)
Addended by: Peggye Ley on: 01/01/2017 02:25 PM   Modules accepted: Orders

## 2017-01-23 ENCOUNTER — Ambulatory Visit: Payer: Medicare Other

## 2017-01-30 ENCOUNTER — Ambulatory Visit: Payer: Medicare Other

## 2017-01-30 ENCOUNTER — Ambulatory Visit
Admission: RE | Admit: 2017-01-30 | Discharge: 2017-01-30 | Disposition: A | Discharge status: Home / Self Care | Payer: Medicare Other | Source: Ambulatory Visit | Attending: Gastroenterology | Admitting: Gastroenterology

## 2017-01-30 DIAGNOSIS — N281 Cyst of kidney, acquired: Secondary | ICD-10-CM | POA: Insufficient documentation

## 2017-01-30 DIAGNOSIS — K5100 Ulcerative (chronic) pancolitis without complications: Secondary | ICD-10-CM | POA: Diagnosis not present

## 2017-01-30 LAB — POCT I-STAT CREATININE: CREATININE: 0.8 mg/dL (ref 0.44–1.00)

## 2017-01-30 MED ORDER — GADOBENATE DIMEGLUMINE 529 MG/ML IV SOLN
20.0000 mL | Freq: Once | INTRAVENOUS | Status: AC | PRN
  Administered 2017-01-30: 20 mL via INTRAVENOUS

## 2017-02-05 ENCOUNTER — Telehealth: Payer: Self-pay

## 2017-02-05 NOTE — Telephone Encounter (Signed)
-----   Message from Jonathon Bellows, MD sent at 02/04/2017 12:27 PM EST ----- MRI shows no GI tract abnormalities- small cyst in kidney

## 2017-02-05 NOTE — Telephone Encounter (Signed)
Advised patient of results per Dr. Vicente Males.   Pt states she has not had lab work done yet but will have it completed by next office visit.   Romero Liner will call patient and schedule follow-up visit.

## 2017-02-25 ENCOUNTER — Ambulatory Visit: Payer: Medicare Other | Admitting: Gastroenterology

## 2017-03-11 ENCOUNTER — Ambulatory Visit: Payer: Medicare Other | Admitting: Gastroenterology

## 2017-04-02 DIAGNOSIS — R1084 Generalized abdominal pain: Secondary | ICD-10-CM | POA: Diagnosis not present

## 2017-04-02 DIAGNOSIS — G8929 Other chronic pain: Secondary | ICD-10-CM | POA: Diagnosis not present

## 2017-04-02 DIAGNOSIS — K51911 Ulcerative colitis, unspecified with rectal bleeding: Secondary | ICD-10-CM | POA: Diagnosis not present

## 2017-04-02 DIAGNOSIS — Z79899 Other long term (current) drug therapy: Secondary | ICD-10-CM | POA: Diagnosis not present

## 2017-04-06 ENCOUNTER — Telehealth: Payer: Self-pay | Admitting: Gastroenterology

## 2017-04-06 NOTE — Telephone Encounter (Signed)
Patient needs you to help her get more Humara. Please call

## 2017-04-07 ENCOUNTER — Telehealth: Payer: Self-pay | Admitting: Gastroenterology

## 2017-04-07 NOTE — Telephone Encounter (Signed)
*  STAT* If patient is at the pharmacy, call can be transferred to refill team.   1. Which medications need to be refilled? (please list name of each medication and dose if known) Humira 40 mg  2. Which pharmacy/location (including street and city if local pharmacy) is medication to be sent to?  3. Do they need a 30 day or 90 day supply?  Please call patient.

## 2017-04-08 NOTE — Telephone Encounter (Signed)
Completed patient's [employer] Humira Rx refill documents and faxed.   Patient has to complete PAP form and send.

## 2017-04-13 ENCOUNTER — Ambulatory Visit: Payer: Medicare Other | Admitting: Gastroenterology

## 2017-04-13 NOTE — Progress Notes (Deleted)
Summary of history : She is here to follow up for ulcerative colitis. Transferred care to me in 12/2016.She was previously seen at [facility]. H/o ulcerative colitis diagnosed in 05/2014 . Tried treatment with ASA and failed. Then was placed on Imuran but was stopped due to Dizziness and headaches. She was subsequently started on Humira , appears was started around 10/2015. Treated with prednisone during flares.  CT abdomen in 05/2014 showed diffuse mild colitis. Colonoscopy 07/2015 - moderately active pan colitis upto hepatic flexure.   Presently taking Humira- every week .    Interval history   01/01/2017-  04/13/2016   01/30/17: MR enterogram - no active inflammation seen . Did not obtain any labs after last visit although ordered.   *** She has 1 bowel movement a day , no blood , formed stool. No cramping , no abdominal pain , no joint pains. She takes tylenol. No NSAID's. Does not drink any alcohol. Has not had a flu shot yet this year. No complaints.     Tiffany Rosales is a 62 y.o. y/o female has been referred for transfer of care from her previous GI. Old GI records reviewed and summarized. 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 Clinically she is in remission .   Plan  1.. CBC,CMP,CRP,Hep A/B serology,Vitamin D,HIV based on ;labs will address health maintenance next visit.  3. No NSAID's 4. Continue weekly humira 5. If in remission based on above will need colonoscopy to confirm endoscopic remission.

## 2017-04-21 ENCOUNTER — Encounter (INDEPENDENT_AMBULATORY_CARE_PROVIDER_SITE_OTHER): Payer: Self-pay

## 2017-04-21 ENCOUNTER — Encounter: Payer: Self-pay | Admitting: Gastroenterology

## 2017-04-21 ENCOUNTER — Ambulatory Visit (INDEPENDENT_AMBULATORY_CARE_PROVIDER_SITE_OTHER): Payer: Medicare HMO | Admitting: Gastroenterology

## 2017-04-21 VITALS — BP 119/81 | HR 106 | Temp 97.8°F | Ht 64.0 in | Wt 211.6 lb

## 2017-04-21 DIAGNOSIS — K5100 Ulcerative (chronic) pancolitis without complications: Secondary | ICD-10-CM

## 2017-04-21 NOTE — Progress Notes (Signed)
Jonathon Bellows MD, MRCP(U.K) [address]  [place]  [place], [place] [address]  Main: [phone]  Fax: [phone]   Primary Care Physician: Patient, No Pcp Per  Primary Gastroenterologist:  Dr. Jonathon Bellows   Chief Complaint  Patient presents with  . Follow-up  . Shaking    HPI: Tiffany SPRUIELL is a 62 y.o. female    Summary of history : She was initially referred and seen by me on 01/01/17. She was previously seen at [facility]. H/o ulcerative colitis diagnosed in 05/2014 . Tried treatment with ASA and failed. Then was placed on Imuran but was stopped due to Dizziness and headaches. She was subsequently started on Humira , appears was started around 8/2017Treated with prednisone during flares.  CT abdomen in 05/2014 showed diffuse mild colitis. Colonoscopy 07/2015 - moderately active pan colitis upto hepatic flexure.     Interval history   01/01/2017-  04/21/2017  01/30/17: MRE: no evidence of active inflammation of small or large bowel   Labs 03/2017: low vitamin D on replacement, TB quant ,HIVnegative, CMP-normal. Hep C was negative, not immune to hep B, hep A IGG not checked, Hb 12.5 with MCV 79    Presently taking Humira- every week . She has 1-2 bowel movement a day , no blood -she cant see , runny  No cramping , no abdominal pain , no joint pains. She takes tylenol. No NSAID's. Does not drink any alcohol.   Has not had a flu shot yet this year.   Current Outpatient Medications  Medication Sig Dispense Refill  . Adalimumab (HUMIRA) 40 MG/0.8ML PSKT Inject 40 mg into the skin.    . cephALEXin (KEFLEX) 500 MG capsule Take 1 capsule (500 mg total) by mouth 2 (two) times daily. 14 capsule 0  . ferrous gluconate (FERGON) 324 MG tablet Take 324 mg by mouth.    Marland Kitchen lisinopril (PRINIVIL,ZESTRIL) 10 MG tablet     . oxyCODONE-acetaminophen (PERCOCET) 7.5-325 MG tablet      No current facility-administered medications for this visit.     Allergies as of 04/21/2017 - Review  Complete 04/21/2017  Allergen Reaction Noted  . Other Other (See Comments) 07/02/2011    ROS:  General: Negative for anorexia, weight loss, fever, chills, fatigue, weakness. ENT: Negative for hoarseness, difficulty swallowing , nasal congestion. CV: Negative for chest pain, angina, palpitations, dyspnea on exertion, peripheral edema.  Respiratory: Negative for dyspnea at rest, dyspnea on exertion, cough, sputum, wheezing.  GI: See history of present illness. GU:  Negative for dysuria, hematuria, urinary incontinence, urinary frequency, nocturnal urination.  Endo: Negative for unusual weight change.    Physical Examination:   BP 119/81 (BP Location: Right Arm, Patient Position: Sitting, Cuff Size: Large)   Pulse (!) 106   Temp 97.8 F (36.6 C) (Oral)   Ht 5' 4"  (1.626 m)   Wt 211 lb 9.6 oz (96 kg)   BMI 36.32 kg/m   General: Well-nourished, well-developed in no acute distress.  Eyes: No icterus. Conjunctivae pink. Mouth: Oropharyngeal mucosa moist and pink , no lesions erythema or exudate. Lungs: Clear to auscultation bilaterally. Non-labored. Heart: Regular rate and rhythm, no murmurs rubs or gallops.  Abdomen: Bowel sounds are normal, nontender, nondistended, no hepatosplenomegaly or masses, no abdominal bruits or hernia , no rebound or guarding.   Extremities: No lower extremity edema. No clubbing or deformities. Neuro: Alert and oriented x 3.  Grossly intact. Skin: Warm and dry, no jaundice.   Psych: Alert and  cooperative, normal mood and affect.   Imaging Studies: No results found.  Assessment and Plan:   Tiffany Rosales is a 62 y.o. y/o female  here to follow up for  ulcerative pan colitis diagnosed in early 2016 , failed ASA,Imuran and now on weekly Humira since 10/2015 . Clinically she is in remission .   Plan  1. Needs Hep A IGG to be tested , needs hep B vaccine , had her flu shot, needs Pneumonia vaccine if not had ,  3. No NSAID's 4. Continue weekly  humira 5. colonoscopy to confirm endoscopic remission of ulcerative colitis.    I have discussed alternative options, risks & benefits,  which include, but are not limited to, bleeding, infection, perforation,respiratory complication & drug reaction.  The patient agrees with this plan & written consent will be obtained.    Dr Jonathon Bellows  MD,MRCP [facility]) Follow up in 4 months.

## 2017-04-21 NOTE — Addendum Note (Signed)
Addended by: Peggye Ley on: 04/21/2017 04:02 PM   Modules accepted: Orders, SmartSet

## 2017-05-05 ENCOUNTER — Telehealth: Payer: Self-pay | Admitting: Gastroenterology

## 2017-05-05 NOTE — Telephone Encounter (Signed)
Returned patients call.  I was unable to leave message due to no voice mail on home or cell phone.  Thanks [employer]

## 2017-05-05 NOTE — Telephone Encounter (Signed)
Patient needs to r/s her procedure.

## 2017-05-11 ENCOUNTER — Telehealth: Payer: Self-pay

## 2017-05-11 ENCOUNTER — Other Ambulatory Visit: Payer: Self-pay

## 2017-05-11 MED ORDER — PEG 3350-KCL-NABCB-NACL-NASULF 236 G PO SOLR: 4000.0000 mL | Freq: Once | ORAL | 0 refills | Status: AC

## 2017-05-11 NOTE — Telephone Encounter (Signed)
Patients colonoscopy has been rescheduled to 06/09/17 due to transportation availability.  Trish in Endo has been made aware of change.

## 2017-06-09 ENCOUNTER — Encounter: Payer: Self-pay | Admitting: *Deleted

## 2017-06-09 ENCOUNTER — Ambulatory Visit: Payer: Medicare HMO | Admitting: Anesthesiology

## 2017-06-09 ENCOUNTER — Encounter
Admission: RE | Disposition: A | Discharge status: Home / Self Care | Payer: Self-pay | Source: Ambulatory Visit | Attending: Gastroenterology

## 2017-06-09 ENCOUNTER — Ambulatory Visit
Admission: RE | Admit: 2017-06-09 | Discharge: 2017-06-09 | Disposition: A | Discharge status: Home / Self Care | Payer: Medicare HMO | Source: Ambulatory Visit | Attending: Gastroenterology | Admitting: Gastroenterology

## 2017-06-09 DIAGNOSIS — Z79899 Other long term (current) drug therapy: Secondary | ICD-10-CM | POA: Diagnosis not present

## 2017-06-09 DIAGNOSIS — I10 Essential (primary) hypertension: Secondary | ICD-10-CM | POA: Insufficient documentation

## 2017-06-09 DIAGNOSIS — Z888 Allergy status to other drugs, medicaments and biological substances status: Secondary | ICD-10-CM | POA: Diagnosis not present

## 2017-06-09 DIAGNOSIS — Z1211 Encounter for screening for malignant neoplasm of colon: Secondary | ICD-10-CM | POA: Diagnosis not present

## 2017-06-09 DIAGNOSIS — D122 Benign neoplasm of ascending colon: Secondary | ICD-10-CM | POA: Diagnosis not present

## 2017-06-09 DIAGNOSIS — D124 Benign neoplasm of descending colon: Secondary | ICD-10-CM | POA: Diagnosis not present

## 2017-06-09 DIAGNOSIS — Z8719 Personal history of other diseases of the digestive system: Secondary | ICD-10-CM | POA: Diagnosis not present

## 2017-06-09 DIAGNOSIS — K5190 Ulcerative colitis, unspecified, without complications: Secondary | ICD-10-CM | POA: Diagnosis not present

## 2017-06-09 DIAGNOSIS — K5100 Ulcerative (chronic) pancolitis without complications: Secondary | ICD-10-CM | POA: Diagnosis not present

## 2017-06-09 DIAGNOSIS — Z8711 Personal history of peptic ulcer disease: Secondary | ICD-10-CM | POA: Insufficient documentation

## 2017-06-09 DIAGNOSIS — K529 Noninfective gastroenteritis and colitis, unspecified: Secondary | ICD-10-CM | POA: Diagnosis not present

## 2017-06-09 DIAGNOSIS — K635 Polyp of colon: Secondary | ICD-10-CM | POA: Diagnosis not present

## 2017-06-09 HISTORY — PX: COLONOSCOPY WITH PROPOFOL: SHX5780

## 2017-06-09 SURGERY — COLONOSCOPY WITH PROPOFOL
Anesthesia: General

## 2017-06-09 MED ORDER — SODIUM CHLORIDE 0.9 % IV SOLN
INTRAVENOUS | Status: DC
  Administered 2017-06-09 (×2): via INTRAVENOUS

## 2017-06-09 MED ORDER — PROPOFOL 500 MG/50ML IV EMUL
INTRAVENOUS | Status: DC | PRN
  Administered 2017-06-09: 100 ug/kg/min via INTRAVENOUS

## 2017-06-09 MED ORDER — PROPOFOL 10 MG/ML IV BOLUS
INTRAVENOUS | Status: DC | PRN
  Administered 2017-06-09: 40 mg via INTRAVENOUS
  Administered 2017-06-09 (×2): 30 mg via INTRAVENOUS
  Administered 2017-06-09: 20 mg via INTRAVENOUS

## 2017-06-09 MED ORDER — PROPOFOL 500 MG/50ML IV EMUL
INTRAVENOUS | Status: AC
  Filled 2017-06-09: qty 50

## 2017-06-09 MED ORDER — PROPOFOL 10 MG/ML IV BOLUS
INTRAVENOUS | Status: AC
  Filled 2017-06-09: qty 20

## 2017-06-09 NOTE — Anesthesia Postprocedure Evaluation (Signed)
Anesthesia Post Note  Patient: Tiffany Rosales  Procedure(s) Performed: COLONOSCOPY WITH PROPOFOL (N/A )  Patient location during evaluation: Endoscopy Anesthesia Type: General Level of consciousness: awake and alert Pain management: pain level controlled Vital Signs Assessment: post-procedure vital signs reviewed and stable Respiratory status: spontaneous breathing, nonlabored ventilation, respiratory function stable and patient connected to nasal cannula oxygen Cardiovascular status: blood pressure returned to baseline and stable Postop Assessment: no apparent nausea or vomiting Anesthetic complications: no     Last Vitals:  Vitals:   06/09/17 1225 06/09/17 1235  BP: 103/80 119/82  Pulse: 84 84  Resp: (!) 21 (!) 22  Temp:    SpO2: 100% 100%    Last Pain:  Vitals:   06/09/17 1044  TempSrc: Tympanic                 Martha Clan

## 2017-06-09 NOTE — Anesthesia Preprocedure Evaluation (Signed)
Anesthesia Evaluation  Patient identified by MRN, date of birth, ID band Patient awake    Reviewed: Allergy & Precautions, H&P , NPO status , Patient's Chart, lab work & pertinent test results, reviewed documented beta blocker date and time   History of Anesthesia Complications Negative for: history of anesthetic complications  Airway Mallampati: I  TM Distance: >3 FB Neck ROM: full    Dental  (+) Dental Advidsory Given   Pulmonary neg pulmonary ROS,           Cardiovascular Exercise Tolerance: Good hypertension, (-) angina(-) CAD, (-) Past MI, (-) Cardiac Stents and (-) CABG (-) dysrhythmias (-) Valvular Problems/Murmurs     Neuro/Psych negative neurological ROS  negative psych ROS   GI/Hepatic Neg liver ROS, PUD, neg GERD  ,  Endo/Other  negative endocrine ROS  Renal/GU negative Renal ROS  negative genitourinary   Musculoskeletal   Abdominal   Peds  Hematology negative hematology ROS (+)   Anesthesia Other Findings Past Medical History: No date: Colitis No date: Detached retina No date: Hypertension No date: Vision impairment   Reproductive/Obstetrics negative OB ROS                             Anesthesia Physical Anesthesia Plan  ASA: II  Anesthesia Plan: General   Post-op Pain Management:    Induction: Intravenous  PONV Risk Score and Plan: 3 and Propofol infusion  Airway Management Planned: Nasal Cannula  Additional Equipment:   Intra-op Plan:   Post-operative Plan:   Informed Consent: I have reviewed the patients History and Physical, chart, labs and discussed the procedure including the risks, benefits and alternatives for the proposed anesthesia with the patient or authorized representative who has indicated his/her understanding and acceptance.   Dental Advisory Given  Plan Discussed with: Anesthesiologist, CRNA and Surgeon  Anesthesia Plan Comments:          Anesthesia Quick Evaluation

## 2017-06-09 NOTE — Op Note (Signed)
[facility] Gastroenterology Patient Name: Tiffany Rosales Procedure Date: 06/09/2017 11:31 AM MRN: [id number] Account #: [id number] Date of Birth: [birth year] Admit Type: Outpatient Age: 62 Room: [facility] ENDO ROOM 1 Gender: Female Note Status: Finalized Procedure:            Colonoscopy Indications:          Follow-up of ulcerative colitis Providers:            Jonathon Bellows MD, MD Referring MD:         No Local Md, MD (Referring MD) Medicines:            Monitored Anesthesia Care Complications:        No immediate complications. Procedure:            Pre-Anesthesia Assessment:                       - Prior to the procedure, a History and Physical was                        performed, and patient medications, allergies and                        sensitivities were reviewed. The patient's tolerance of                        previous anesthesia was reviewed.                       - The risks and benefits of the procedure and the                        sedation options and risks were discussed with the                        patient. All questions were answered and informed                        consent was obtained.                       - ASA Grade Assessment: III - A patient with severe                        systemic disease.                       After obtaining informed consent, the colonoscope was                        passed under direct vision. Throughout the procedure,                        the patient's blood pressure, pulse, and oxygen                        saturations were monitored continuously. The                        Colonoscope was introduced through the anus and  advanced to the the terminal ileum. The colonoscopy was                        performed with ease. The patient tolerated the                        procedure well. The quality of the bowel preparation                        was good. Findings:      The perianal  and digital rectal examinations were normal.      Two sessile polyps were found in the descending colon and ascending       colon. The polyps were 4 to 7 mm in size. These polyps were removed with       a cold biopsy forceps. Resection and retrieval were complete.      A 10 mm polyp was found in the proximal ascending colon. The polyp was       sessile. The polyp was removed with a cold snare. Resection and       retrieval were complete. To prevent bleeding after the polypectomy, one       hemostatic clip was successfully placed. There was no bleeding during,       or at the end, of the procedure.      A patchy area of mildly erythematous mucosa was found in the sigmoid       colon. This was biopsied with a cold forceps for histology.      The exam was otherwise normal throughout the examined colon.      The terminal ileum appeared normal. Biopsies were taken with a cold       forceps for histology.      The rectum, descending colon, transverse colon and ascending colon       appeared normal. Biopsies were taken with a cold forceps for histology.      No additional abnormalities were found on retroflexion. Impression:           - Two 4 to 7 mm polyps in the descending colon and in                        the ascending colon, removed with a cold biopsy                        forceps. Resected and retrieved.                       - One 10 mm polyp in the proximal ascending colon,                        removed with a cold snare. Resected and retrieved. Clip                        was placed.                       - Erythematous mucosa in the sigmoid colon. Biopsied.                       - The examined portion of the ileum was normal.  Biopsied.                       - The rectum, descending colon, transverse colon and                        ascending colon are normal. Biopsied. Recommendation:       - Discharge patient to home (with escort).                       -  Resume previous diet.                       - Continue present medications.                       - Await pathology results.                       - Repeat colonoscopy for surveillance based on                        pathology results.                       - Return to my office as previously scheduled. Procedure Code(s):    --- Professional ---                       [phone], Colonoscopy, flexible; with removal of tumor(s),                        polyp(s), or other lesion(s) by snare technique                       45380, 44, Colonoscopy, flexible; with biopsy, single                        or multiple Diagnosis Code(s):    --- Professional ---                       D12.4, Benign neoplasm of descending colon                       D12.2, Benign neoplasm of ascending colon                       K63.89, Other specified diseases of intestine                       K51.90, Ulcerative colitis, unspecified, without                        complications CPT copyright 2016 American Medical Association. All rights reserved. The codes documented in this report are preliminary and upon coder review may  be revised to meet current compliance requirements. Jonathon Bellows, MD Jonathon Bellows MD, MD 06/09/2017 12:13:21 PM This report has been signed electronically. Number of Addenda: 0 Note Initiated On: 06/09/2017 11:31 AM Scope Withdrawal Time: 0 hours 23 minutes 54 seconds  Total Procedure Duration: 0 hours 26 minutes 53 seconds       [facility]

## 2017-06-09 NOTE — Anesthesia Post-op Follow-up Note (Signed)
Anesthesia QCDR form completed.        

## 2017-06-09 NOTE — Transfer of Care (Signed)
Immediate Anesthesia Transfer of Care Note  Patient: Tiffany Rosales  Procedure(s) Performed: COLONOSCOPY WITH PROPOFOL (N/A )  Patient Location: PACU  Anesthesia Type:General  Level of Consciousness: sedated  Airway & Oxygen Therapy: Patient Spontanous Breathing and Patient connected to nasal cannula oxygen  Post-op Assessment: Report given to RN and Post -op Vital signs reviewed and stable  Post vital signs: Reviewed and stable  Last Vitals:  Vitals:   06/09/17 1044  BP: 125/86  Pulse: 94  Resp: 18  Temp: (!) 36.2 C  SpO2: 99%    Last Pain:  Vitals:   06/09/17 1044  TempSrc: Tympanic         Complications: No apparent anesthesia complications

## 2017-06-09 NOTE — H&P (Signed)
Jonathon Bellows, MD [address] [place], [place], [place], [address] [address], [place], [place], [place], [address] Phone: [phone]  Fax: [phone]  Primary Care Physician:  System, Provider Not In   Pre-Procedure History & Physical: HPI:  Tiffany Rosales is a 62 y.o. female is here for an colonoscopy.   Past Medical History:  Diagnosis Date  . Colitis   . Detached retina   . Hypertension   . Vision impairment     Past Surgical History:  Procedure Laterality Date  . ABDOMINAL HYSTERECTOMY    . EYE SURGERY     1982    Prior to Admission medications   Medication Sig Start Date End Date Taking? Authorizing Provider  Adalimumab (HUMIRA) 40 MG/0.8ML PSKT Inject 40 mg into the skin. 07/18/16  Yes [provider]  lisinopril (PRINIVIL,ZESTRIL) 10 MG tablet  12/21/16  Yes [provider]  Multiple Vitamins-Minerals (MULTIVITAMIN WITH MINERALS) tablet Take 1 tablet by mouth daily.   Yes [provider]  oxyCODONE-acetaminophen (PERCOCET) 7.5-325 MG tablet  12/05/16  Yes [provider]  cephALEXin (KEFLEX) 500 MG capsule Take 1 capsule (500 mg total) by mouth 2 (two) times daily. Patient not taking: Reported on 06/09/2017 06/16/15   Lisa Roca, MD  ferrous gluconate (FERGON) 324 MG tablet Take 324 mg by mouth. 04/23/16   [provider]    Allergies as of 04/22/2017 - Review Complete 04/21/2017  Allergen Reaction Noted  . Other Other (See Comments) 07/02/2011    History reviewed. No pertinent family history.  Social History   Socioeconomic History  . Marital status: Single    Spouse name: Not on file  . Number of children: Not on file  . Years of education: Not on file  . Highest education level: Not on file  Social Needs  . Financial resource strain: Not on file  . Food insecurity - worry: Not on file  . Food insecurity - inability: Not on file  . Transportation needs - medical: Not on file  .  Transportation needs - non-medical: Not on file  Occupational History  . Not on file  Tobacco Use  . Smoking status: Never Smoker  . Smokeless tobacco: Never Used  Substance and Sexual Activity  . Alcohol use: No  . Drug use: No  . Sexual activity: No  Other Topics Concern  . Not on file  Social History Narrative  . Not on file    Review of Systems: See HPI, otherwise negative ROS  Physical Exam: BP 125/86   Pulse 94   Temp (!) 97.1 F (36.2 C) (Tympanic)   Resp 18   Ht 5' 4"  (1.626 m)   Wt 211 lb (95.7 kg)   SpO2 99%   BMI 36.22 kg/m  General:   Alert,  pleasant and cooperative in NAD Head:  Normocephalic and atraumatic. Neck:  Supple; no masses or thyromegaly. Lungs:  Clear throughout to auscultation, normal respiratory effort.    Heart:  +S1, +S2, Regular rate and rhythm, No edema. Abdomen:  Soft, nontender and nondistended. Normal bowel sounds, without guarding, and without rebound.   Neurologic:  Alert and  oriented x4;  grossly normal neurologically.  Impression/Plan: Tiffany Rosales is here for an colonoscopy to be performed for ulcerative colitis.     Risks, benefits, limitations, and alternatives regarding  colonoscopy have been reviewed with the patient.  Questions have been answered.  All parties agreeable.   Jonathon Bellows, MD  06/09/2017,  11:30 AM

## 2017-06-10 ENCOUNTER — Encounter: Payer: Self-pay | Admitting: Gastroenterology

## 2017-06-11 LAB — SURGICAL PATHOLOGY

## 2017-06-16 ENCOUNTER — Other Ambulatory Visit: Payer: Self-pay | Admitting: Family Medicine

## 2017-06-16 DIAGNOSIS — Z1231 Encounter for screening mammogram for malignant neoplasm of breast: Secondary | ICD-10-CM

## 2017-06-18 ENCOUNTER — Encounter: Payer: Self-pay | Admitting: Gastroenterology

## 2017-06-24 ENCOUNTER — Other Ambulatory Visit: Payer: Self-pay

## 2017-06-29 ENCOUNTER — Encounter: Payer: Self-pay | Admitting: Gastroenterology

## 2017-06-29 NOTE — Telephone Encounter (Signed)
error 

## 2017-07-02 DIAGNOSIS — G8929 Other chronic pain: Secondary | ICD-10-CM | POA: Diagnosis not present

## 2017-07-02 DIAGNOSIS — M5431 Sciatica, right side: Secondary | ICD-10-CM | POA: Diagnosis not present

## 2017-07-02 DIAGNOSIS — M792 Neuralgia and neuritis, unspecified: Secondary | ICD-10-CM | POA: Diagnosis not present

## 2017-07-02 DIAGNOSIS — R1084 Generalized abdominal pain: Secondary | ICD-10-CM | POA: Diagnosis not present

## 2017-07-22 ENCOUNTER — Ambulatory Visit: Payer: Medicare HMO | Admitting: Gastroenterology

## 2017-07-27 DIAGNOSIS — G8929 Other chronic pain: Secondary | ICD-10-CM | POA: Diagnosis not present

## 2017-07-27 DIAGNOSIS — M5431 Sciatica, right side: Secondary | ICD-10-CM | POA: Diagnosis not present

## 2017-07-27 DIAGNOSIS — R1084 Generalized abdominal pain: Secondary | ICD-10-CM | POA: Diagnosis not present

## 2017-08-10 ENCOUNTER — Ambulatory Visit
Admission: RE | Admit: 2017-08-10 | Discharge: 2017-08-10 | Disposition: A | Discharge status: Home / Self Care | Payer: Medicare HMO | Source: Ambulatory Visit | Attending: Family Medicine | Admitting: Family Medicine

## 2017-08-10 DIAGNOSIS — Z1231 Encounter for screening mammogram for malignant neoplasm of breast: Secondary | ICD-10-CM | POA: Diagnosis not present

## 2017-08-20 ENCOUNTER — Ambulatory Visit: Payer: Medicare HMO | Admitting: Gastroenterology

## 2017-09-02 ENCOUNTER — Ambulatory Visit: Payer: Medicare HMO | Admitting: Gastroenterology

## 2017-10-21 ENCOUNTER — Ambulatory Visit (INDEPENDENT_AMBULATORY_CARE_PROVIDER_SITE_OTHER): Payer: Medicare HMO | Admitting: Gastroenterology

## 2017-10-21 ENCOUNTER — Other Ambulatory Visit: Payer: Self-pay

## 2017-10-21 ENCOUNTER — Encounter: Payer: Self-pay | Admitting: Gastroenterology

## 2017-10-21 VITALS — BP 131/90 | HR 112 | Ht 64.0 in | Wt 228.6 lb

## 2017-10-21 DIAGNOSIS — Z1283 Encounter for screening for malignant neoplasm of skin: Secondary | ICD-10-CM

## 2017-10-21 DIAGNOSIS — K5100 Ulcerative (chronic) pancolitis without complications: Secondary | ICD-10-CM | POA: Diagnosis not present

## 2017-10-21 DIAGNOSIS — Z13820 Encounter for screening for osteoporosis: Secondary | ICD-10-CM

## 2017-10-21 NOTE — Progress Notes (Signed)
Jonathon Bellows MD, MRCP(U.K) [address]  [place]  [place], [place] [address]  Main: [phone]  Fax: [phone]   Primary Care Physician: System, Provider Not In  Primary Gastroenterologist:  Dr. Jonathon Bellows   No chief complaint on file.   HPI: Tiffany Rosales is a 62 y.o. female   Summary of history : She was initially referred and seen by me on 01/01/17. She was previously seen at [facility]. H/o ulcerative colitis diagnosed in 05/2014 . Tried treatment with ASA and failed. Then was placed on Imuran but was stopped due to Dizziness and headaches.She was subsequently started on Humira , appears was started around 8/2017Treated with prednisone during flares. CT abdomen in 05/2014 showed diffuse mild colitis. Colonoscopy 07/2015 - moderately active pan colitis upto hepatic flexure.   01/30/17: MRE: no evidence of active inflammation of small or large bowel   Labs 03/2017: low vitamin D on replacement, TB quant ,HIVnegative, CMP-normal. Hep C was negative, not immune to hep B, hep A IGG not checked, Hb 12.5 with MCV 79   Interval history 04/21/2017-10/21/17   Colonoscopy 05/2017 - TI bx- normal, Rt colon bx - normal , 5 polyps x adenomas excised . No colitis in Tx colon , left clon ,sigmoid colon , rectum   Says she has a bowel movement 3 times a day , watery , bloody when she wipes. Mild pain. , no nocturnal symptoms. No NSAID's or smoking. Not had a pap smear this year.    Current Outpatient Medications  Medication Sig Dispense Refill  . Acetaminophen 500 MG coapsule     . Adalimumab (HUMIRA) 40 MG/0.8ML PSKT Inject 40 mg into the skin.    . cephALEXin (KEFLEX) 500 MG capsule Take 1 capsule (500 mg total) by mouth 2 (two) times daily. (Patient not taking: Reported on 06/09/2017) 14 capsule 0  . ferrous gluconate (FERGON) 324 MG tablet Take 324 mg by mouth.    . gabapentin (NEURONTIN) 100 MG capsule 1 tab PO QHS inc to 3 tabs PO QHS over the next 10 days    . lisinopril  (PRINIVIL,ZESTRIL) 10 MG tablet     . Multiple Vitamins-Minerals (MULTIVITAMIN WITH MINERALS) tablet Take 1 tablet by mouth daily.    Marland Kitchen oxyCODONE-acetaminophen (PERCOCET) 7.5-325 MG tablet      No current facility-administered medications for this visit.     Allergies as of 10/21/2017 - Review Complete 06/09/2017  Allergen Reaction Noted  . Other Other (See Comments) 07/02/2011    ROS:  General: Negative for anorexia, weight loss, fever, chills, fatigue, weakness. ENT: Negative for hoarseness, difficulty swallowing , nasal congestion. CV: Negative for chest pain, angina, palpitations, dyspnea on exertion, peripheral edema.  Respiratory: Negative for dyspnea at rest, dyspnea on exertion, cough, sputum, wheezing.  GI: See history of present illness. GU:  Negative for dysuria, hematuria, urinary incontinence, urinary frequency, nocturnal urination.  Endo: Negative for unusual weight change.    Physical Examination:   There were no vitals taken for this visit.  General: Well-nourished, well-developed in no acute distress.  Eyes: No icterus. Conjunctivae pink. Mouth: Oropharyngeal mucosa moist and pink , no lesions erythema or exudate. Lungs: Clear to auscultation bilaterally. Non-labored. Heart: Regular rate and rhythm, no murmurs rubs or gallops.  Abdomen: Bowel sounds are normal, nontender, nondistended, no hepatosplenomegaly or masses, no abdominal bruits or hernia , no rebound or guarding.   Extremities: No lower extremity edema. No clubbing or deformities. Neuro: Alert and oriented x 3.  Grossly intact. Skin: Warm and dry, no jaundice.   Psych: Alert and cooperative, normal mood and affect.   Imaging Studies: No results found.  Assessment and Plan:   Tiffany Rosales is a 62 y.o. 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 Clinically appears she has symtoms of  recurrence and failure of Humira  .   Plan  1. Annual skin exam  2. No NSAID's 3. Annual flu shot , TDAP if due , pneumococcal vaccine if not had in past 5 years with PCP 4. Continue weekly humira 5. Colonoscopy for colon cancer surveillance in 05/2020 6. CBC,CMP,DEXA bone scan , CRP, TB gold testing,vitamin D  7. Flexible sigmoidoscopy and bx to r/o active colitis and viral colitis 8.Stool tests to r/o c diff and GI PCR   I have discussed alternative options, risks & benefits,  which include, but are not limited to, bleeding, infection, perforation,respiratory complication & drug reaction.  The patient agrees with this plan & written consent will be obtained.     Dr Jonathon Bellows  MD,MRCP [facility]) Follow up in 6 weeks

## 2017-10-21 NOTE — Addendum Note (Signed)
Addended by: Earl Lagos on: 10/21/2017 05:40 PM   Modules accepted: Orders, SmartSet

## 2017-10-22 ENCOUNTER — Other Ambulatory Visit
Admission: RE | Admit: 2017-10-22 | Discharge: 2017-10-22 | Disposition: A | Discharge status: Home / Self Care | Payer: Medicare HMO | Source: Ambulatory Visit | Attending: Gastroenterology | Admitting: Gastroenterology

## 2017-10-22 ENCOUNTER — Telehealth: Payer: Self-pay | Admitting: Gastroenterology

## 2017-10-22 DIAGNOSIS — K5100 Ulcerative (chronic) pancolitis without complications: Secondary | ICD-10-CM | POA: Diagnosis not present

## 2017-10-22 LAB — GASTROINTESTINAL PANEL BY PCR, STOOL (REPLACES STOOL CULTURE)
ASTROVIRUS: NOT DETECTED
Adenovirus F40/41: NOT DETECTED
CAMPYLOBACTER SPECIES: NOT DETECTED
Cryptosporidium: NOT DETECTED
Cyclospora cayetanensis: NOT DETECTED
ENTAMOEBA HISTOLYTICA: NOT DETECTED
Enteroaggregative E coli (EAEC): NOT DETECTED
Enteropathogenic E coli (EPEC): NOT DETECTED
Enterotoxigenic E coli (ETEC): NOT DETECTED
Giardia lamblia: NOT DETECTED
NOROVIRUS GI/GII: NOT DETECTED
PLESIMONAS SHIGELLOIDES: NOT DETECTED
Rotavirus A: NOT DETECTED
SALMONELLA SPECIES: NOT DETECTED
SAPOVIRUS (I, II, IV, AND V): NOT DETECTED
SHIGELLA/ENTEROINVASIVE E COLI (EIEC): NOT DETECTED
Shiga like toxin producing E coli (STEC): NOT DETECTED
VIBRIO CHOLERAE: NOT DETECTED
Vibrio species: NOT DETECTED
Yersinia enterocolitica: NOT DETECTED

## 2017-10-22 LAB — CBC WITH DIFFERENTIAL/PLATELET
BASOS ABS: 0.1 10*3/uL (ref 0–0.1)
BASOS PCT: 1 %
EOS ABS: 0.2 10*3/uL (ref 0–0.7)
EOS PCT: 3 %
HCT: 39.2 % (ref 35.0–47.0)
HEMOGLOBIN: 12.6 g/dL (ref 12.0–16.0)
LYMPHS ABS: 2.8 10*3/uL (ref 1.0–3.6)
Lymphocytes Relative: 35 %
MCH: 26.1 pg (ref 26.0–34.0)
MCHC: 32.2 g/dL (ref 32.0–36.0)
MCV: 81.1 fL (ref 80.0–100.0)
Monocytes Absolute: 0.6 10*3/uL (ref 0.2–0.9)
Monocytes Relative: 8 %
NEUTROS PCT: 53 %
Neutro Abs: 4.3 10*3/uL (ref 1.4–6.5)
Platelets: 184 10*3/uL (ref 150–440)
RBC: 4.83 MIL/uL (ref 3.80–5.20)
RDW: 14.9 % — ABNORMAL HIGH (ref 11.5–14.5)
WBC: 8.1 10*3/uL (ref 3.6–11.0)

## 2017-10-22 LAB — COMPREHENSIVE METABOLIC PANEL
ALBUMIN: 3.8 g/dL (ref 3.5–5.0)
ALK PHOS: 109 U/L (ref 38–126)
ALT: 24 U/L (ref 0–44)
AST: 26 U/L (ref 15–41)
Anion gap: 6 (ref 5–15)
BUN: 12 mg/dL (ref 8–23)
CALCIUM: 10.3 mg/dL (ref 8.9–10.3)
CO2: 22 mmol/L (ref 22–32)
CREATININE: 0.82 mg/dL (ref 0.44–1.00)
Chloride: 113 mmol/L — ABNORMAL HIGH (ref 98–111)
GFR calc Af Amer: >60 mL/min (ref >60)
GFR calc non Af Amer: >60 mL/min (ref >60)
GLUCOSE: 98 mg/dL (ref 70–99)
Potassium: 3.9 mmol/L (ref 3.5–5.1)
SODIUM: 141 mmol/L (ref 135–145)
Total Bilirubin: 1.1 mg/dL (ref 0.3–1.2)
Total Protein: 7.7 g/dL (ref 6.5–8.1)

## 2017-10-22 LAB — C-REACTIVE PROTEIN: CRP: <0.8 mg/dL (ref <1.0)

## 2017-10-22 LAB — C DIFFICILE QUICK SCREEN W PCR REFLEX
C DIFFICILE (CDIFF) INTERP: NOT DETECTED
C DIFFICILE (CDIFF) TOXIN: NEGATIVE
C DIFFICLE (CDIFF) ANTIGEN: NEGATIVE

## 2017-10-22 NOTE — Telephone Encounter (Signed)
PT is calling to get number to have her Bone Dexa scan done she states the number  Given to her was no longer working

## 2017-10-23 ENCOUNTER — Encounter: Payer: Self-pay | Admitting: Gastroenterology

## 2017-10-23 LAB — CALCITRIOL (1,25 DI-OH VIT D): Vit D, 1,25-Dihydroxy: 88 pg/mL — ABNORMAL HIGH (ref 19.9–79.3)

## 2017-10-23 LAB — HEPATITIS B SURFACE ANTIBODY, QUANTITATIVE: Hepatitis B-Post: <3.1 m[IU]/mL — ABNORMAL LOW (ref >9.9)

## 2017-10-23 LAB — HEPATITIS A ANTIBODY, TOTAL: Hep A Total Ab: NEGATIVE

## 2017-10-23 NOTE — Telephone Encounter (Signed)
Patient has been scheduled for DEXA Scan.  She would like to know if there is an alternative to the fleets enema for her flex sigmoidoscopy.  She is legally blind and stated that her son, and sister are not comfortable inserting the enema.  Please advise.  Thanks [employer]

## 2017-10-26 ENCOUNTER — Telehealth: Payer: Self-pay

## 2017-10-26 ENCOUNTER — Other Ambulatory Visit: Payer: Self-pay

## 2017-10-26 LAB — QUANTIFERON-TB GOLD PLUS (RQFGPL)
QUANTIFERON TB2 AG VALUE: 0.04 [IU]/mL
QuantiFERON Mitogen Value: >10 IU/mL
QuantiFERON Nil Value: 0.05 IU/mL
QuantiFERON TB1 Ag Value: 0.04 IU/mL

## 2017-10-26 LAB — QUANTIFERON-TB GOLD PLUS: QUANTIFERON-TB GOLD PLUS: NEGATIVE

## 2017-10-26 MED ORDER — PEG 3350-KCL-NABCB-NACL-NASULF 236 G PO SOLR: 4000.0000 mL | Freq: Once | ORAL | 0 refills | Status: AC

## 2017-10-26 NOTE — Telephone Encounter (Signed)
LVM for pt to let her know that she may use Gol[employer] to prepare her for her Flex Sigmoid begin the evening before procedure.  Prescription has been sent to pharmacy.  Thanks [employer]

## 2017-10-26 NOTE — Telephone Encounter (Signed)
Can do  golytely

## 2017-10-28 ENCOUNTER — Telehealth: Payer: Self-pay | Admitting: Gastroenterology

## 2017-10-28 ENCOUNTER — Telehealth: Payer: Self-pay

## 2017-10-28 NOTE — Telephone Encounter (Signed)
Pt would like to know her results of her blood work before her procedure tomorrow

## 2017-10-28 NOTE — Telephone Encounter (Signed)
1. CBC,CMP,CRP,Stool tests were normal  2. Vitamin D was high can stop if taking tabs 3. Needs Hep A/B vaccine

## 2017-10-28 NOTE — Telephone Encounter (Signed)
Dr. Vicente Males please advise on patients lab results if available.  Thank you, Sharyn Lull

## 2017-10-28 NOTE — Telephone Encounter (Signed)
LVM for pt  Informing her of her lab results.  Advised her to stop Vit D because it is high. She needs Hep A/B vaccination.  Asked pt to call office back to confirm she received message.  Thanks [employer]

## 2017-10-29 ENCOUNTER — Encounter: Payer: Self-pay | Admitting: *Deleted

## 2017-10-29 ENCOUNTER — Ambulatory Visit
Admission: RE | Admit: 2017-10-29 | Discharge: 2017-10-29 | Disposition: A | Discharge status: Home / Self Care | Payer: Medicare HMO | Source: Ambulatory Visit | Attending: Gastroenterology | Admitting: Gastroenterology

## 2017-10-29 ENCOUNTER — Encounter
Admission: RE | Disposition: A | Discharge status: Home / Self Care | Payer: Self-pay | Source: Ambulatory Visit | Attending: Gastroenterology

## 2017-10-29 DIAGNOSIS — I10 Essential (primary) hypertension: Secondary | ICD-10-CM | POA: Diagnosis not present

## 2017-10-29 DIAGNOSIS — K5100 Ulcerative (chronic) pancolitis without complications: Secondary | ICD-10-CM | POA: Diagnosis not present

## 2017-10-29 DIAGNOSIS — K51019 Ulcerative (chronic) pancolitis with unspecified complications: Secondary | ICD-10-CM | POA: Diagnosis not present

## 2017-10-29 DIAGNOSIS — Z79899 Other long term (current) drug therapy: Secondary | ICD-10-CM | POA: Insufficient documentation

## 2017-10-29 HISTORY — PX: FLEXIBLE SIGMOIDOSCOPY: SHX5431

## 2017-10-29 SURGERY — SIGMOIDOSCOPY, FLEXIBLE
Anesthesia: General

## 2017-10-29 MED ORDER — SODIUM CHLORIDE 0.9 % IV SOLN
INTRAVENOUS | Status: DC
  Administered 2017-10-29: 1000 mL via INTRAVENOUS

## 2017-10-29 NOTE — OR Nursing (Signed)
Pt arrives via stretcher, received no meds during procedure.  Monitors removed. Pt voices she is ready to get dressed and go home.

## 2017-10-29 NOTE — H&P (Signed)
Jonathon Bellows, MD [address] [place], [place], [place], [address] [address], [place], [place], [place], [address] Phone: [phone]  Fax: [phone]  Primary Care Physician:  System, Provider Not In   Pre-Procedure History & Physical: HPI:  Tiffany Rosales is a 62 y.o. female is here for a sigmoidoscopy    Past Medical History:  Diagnosis Date  . Colitis   . Detached retina   . Hypertension   . Vision impairment     Past Surgical History:  Procedure Laterality Date  . ABDOMINAL HYSTERECTOMY    . COLONOSCOPY WITH PROPOFOL N/A 06/09/2017   Procedure: COLONOSCOPY WITH PROPOFOL;  Surgeon: Jonathon Bellows, MD;  Location: [facility] ENDOSCOPY;  Service: Gastroenterology;  Laterality: N/A;  . EYE SURGERY     1982    Prior to Admission medications   Medication Sig Start Date End Date Taking? Authorizing Provider  Acetaminophen 500 MG coapsule  06/04/17  Yes [provider]  Adalimumab (HUMIRA) 40 MG/0.8ML PSKT Inject 40 mg into the skin. 07/18/16  Yes [provider]  lisinopril (PRINIVIL,ZESTRIL) 10 MG tablet  12/21/16  Yes [provider]  Multiple Vitamins-Minerals (MULTIVITAMIN WITH MINERALS) tablet Take 1 tablet by mouth daily.   Yes [provider]  oxyCODONE-acetaminophen (PERCOCET) 7.5-325 MG tablet  12/05/16  Yes [provider]  cephALEXin (KEFLEX) 500 MG capsule Take 1 capsule (500 mg total) by mouth 2 (two) times daily. Patient not taking: Reported on 06/09/2017 06/16/15   Lisa Roca, MD  ferrous gluconate (FERGON) 324 MG tablet Take 324 mg by mouth. 04/23/16   [provider]  gabapentin (NEURONTIN) 100 MG capsule 1 tab PO QHS inc to 3 tabs PO QHS over the next 10 days 07/02/17   [provider]    Allergies as of 10/22/2017 - Review Complete 10/21/2017  Allergen Reaction Noted  . Other Other (See Comments) 07/02/2011    History reviewed. No pertinent family history.  Social History    Socioeconomic History  . Marital status: Single    Spouse name: Not on file  . Number of children: Not on file  . Years of education: Not on file  . Highest education level: Not on file  Occupational History  . Not on file  Social Needs  . Financial resource strain: Not on file  . Food insecurity:    Worry: Not on file    Inability: Not on file  . Transportation needs:    Medical: Not on file    Non-medical: Not on file  Tobacco Use  . Smoking status: Never Smoker  . Smokeless tobacco: Never Used  Substance and Sexual Activity  . Alcohol use: No  . Drug use: No  . Sexual activity: Never  Lifestyle  . Physical activity:    Days per week: Not on file    Minutes per session: Not on file  . Stress: Not on file  Relationships  . Social connections:    Talks on phone: Not on file    Gets together: Not on file    Attends religious service: Not on file    Active member of club or organization: Not on file    Attends meetings of clubs or organizations: Not on file    Relationship status: Not on file  . Intimate partner violence:    Fear of current or ex partner: Not on file    Emotionally abused: Not on file    Physically abused: Not on file  Forced sexual activity: Not on file  Other Topics Concern  . Not on file  Social History Narrative  . Not on file    Review of Systems: See HPI, otherwise negative ROS  Physical Exam: BP (!) 138/93   Pulse (!) 110   Temp (!) 96.2 F (35.7 C) (Tympanic)   Resp 18   Ht 5' 7"  (1.702 m)   Wt 225 lb (102.1 kg)   SpO2 97%   BMI 35.24 kg/m  General:   Alert,  pleasant and cooperative in NAD Head:  Normocephalic and atraumatic. Neck:  Supple; no masses or thyromegaly. Lungs:  Clear throughout to auscultation, normal respiratory effort.    Heart:  +S1, +S2, Regular rate and rhythm, No edema. Abdomen:  Soft, nontender and nondistended. Normal bowel sounds, without guarding, and without rebound.   Neurologic:  Alert and   oriented x4;  grossly normal neurologically.  Impression/Plan: Tiffany Rosales is here for an colonoscopy to be performed for  A sigmoidoscopy to evaluate for colitis.   Risks, benefits, limitations, and alternatives regarding  colonoscopy have been reviewed with the patient.  Questions have been answered.  All parties agreeable.   Jonathon Bellows, MD  10/29/2017, 10:18 AM

## 2017-10-29 NOTE — Op Note (Addendum)
[facility] Gastroenterology Patient Name: Tiffany Rosales Procedure Date: 10/29/2017 10:23 AM MRN: [id number] Account #: [id number] Date of Birth: 11-09-1955 Admit Type: Outpatient Age: 62 Room: [facility] ENDO ROOM 4 Gender: Female Note Status: Finalized Procedure:            Flexible Sigmoidoscopy Indications:          Follow-up of chronic ulcerative pancolitis Providers:            Jonathon Bellows MD, MD Medicines:            None Complications:        No immediate complications. Procedure:            Pre-Anesthesia Assessment:                       - Prior to the procedure, a History and Physical was                        performed, and patient medications, allergies and                        sensitivities were reviewed. The patient's tolerance of                        previous anesthesia was reviewed.                       - The risks and benefits of the procedure and the                        sedation options and risks were discussed with the                        patient. All questions were answered and informed                        consent was obtained.                       - ASA Grade Assessment: II - A patient with mild                        systemic disease.                       After obtaining informed consent, the scope was passed                        under direct vision. The Colonoscope was introduced                        through the anus and advanced to the the left                        transverse colon. The flexible sigmoidoscopy was                        accomplished with ease. The patient tolerated the                        procedure well. The quality of the bowel preparation  was good. Findings:      The perianal and digital rectal examinations were normal.      Localized mild inflammation characterized by congestion (edema) and       erythema was found in the mid sigmoid colon. Biopsies were taken with a   cold forceps for histology.      The exam was otherwise without abnormality. Impression:           - Localized mild inflammation was found in the mid                        sigmoid colon secondary to colitis. Biopsied.                       - The examination was otherwise normal. Recommendation:       - Discharge patient to home (with escort). Procedure Code(s):    --- Professional ---                       [phone], Sigmoidoscopy, flexible; with biopsy, single or                        multiple Diagnosis Code(s):    --- Professional ---                       K52.9, Noninfective gastroenteritis and colitis,                        unspecified                       K51.00, Ulcerative (chronic) pancolitis without                        complications CPT copyright 2017 American Medical Association. All rights reserved. The codes documented in this report are preliminary and upon coder review may  be revised to meet current compliance requirements. Jonathon Bellows, MD Jonathon Bellows MD, MD 10/29/2017 10:43:55 AM This report has been signed electronically. Number of Addenda: 0 Note Initiated On: 10/29/2017 10:23 AM Total Procedure Duration: 0 hours 7 minutes 41 seconds       [facility]

## 2017-10-30 LAB — CYTOMEGALOVIRUS (CMV) CULTURE - CMVCUL

## 2017-10-30 LAB — SURGICAL PATHOLOGY

## 2017-10-30 NOTE — OR Nursing (Signed)
PT had slight bleeding per rectumm when she got home yesterday. No further bleeding  DR ANNA NOTIFIED. Output by Drain (mL) 10/28/17 0701 - 10/28/17 1900 10/28/17 1901 - 10/29/17 0700 10/29/17 0701 - 10/29/17 1900 10/29/17 1901 - 10/30/17 0700 10/30/17 0701 - 10/30/17 [id number]  Patient has no LDAs of requested type attached.

## 2017-11-02 LAB — HERPES SIMPLEX VIRUS(HSV) DNA BY PCR
HSV 1 DNA: NEGATIVE
HSV 2 DNA: NEGATIVE

## 2017-11-09 ENCOUNTER — Telehealth: Payer: Self-pay

## 2017-11-09 NOTE — Telephone Encounter (Signed)
LVM for pt to contact office to discuss her results.  Thanks [employer]

## 2017-11-09 NOTE — Telephone Encounter (Signed)
LVM for pt to contact office.  Thanks [employer]

## 2017-11-09 NOTE — Telephone Encounter (Signed)
-----   Message from Jonathon Bellows, MD sent at 11/09/2017 10:40 AM EDT ----- Tiffany Rosales inform that biopsies show that she has moderate colitis in a small area which I did explain on day of procedure.   Options are 1. Add rowasa to Humira- once daily at night for a few weeks- if works ok if not may have to change to [employer].  2. She can do this and we can discuss further at her office visit 3. Can you give script for Rowasa enemas 4. Ensure not on NSAID;s

## 2017-11-09 NOTE — Telephone Encounter (Signed)
-----   Message from Jonathon Bellows, MD sent at 11/09/2017 11:32 AM EDT ----- Tiffany Rosales needs Hep A/B vaccine

## 2017-11-13 NOTE — Progress Notes (Signed)
Patient was notified that she needs to have Hep A/B vaccine and we could administer in office.

## 2017-11-16 ENCOUNTER — Other Ambulatory Visit: Payer: Self-pay

## 2017-11-26 LAB — CYTOMEGALOVIRUS (CMV) CULTURE - CMVCUL

## 2017-12-02 ENCOUNTER — Ambulatory Visit: Payer: Medicare HMO | Admitting: Gastroenterology

## 2017-12-04 DIAGNOSIS — Z23 Encounter for immunization: Secondary | ICD-10-CM | POA: Insufficient documentation

## 2017-12-04 DIAGNOSIS — G8929 Other chronic pain: Secondary | ICD-10-CM | POA: Diagnosis not present

## 2017-12-04 DIAGNOSIS — R1084 Generalized abdominal pain: Secondary | ICD-10-CM | POA: Diagnosis not present

## 2018-01-06 ENCOUNTER — Ambulatory Visit: Payer: Medicare HMO | Admitting: Gastroenterology

## 2018-01-13 ENCOUNTER — Ambulatory Visit: Payer: Medicare HMO | Admitting: Gastroenterology

## 2018-02-24 ENCOUNTER — Telehealth: Payer: Self-pay | Admitting: Gastroenterology

## 2018-02-24 ENCOUNTER — Ambulatory Visit: Payer: Medicare HMO | Admitting: Gastroenterology

## 2018-02-24 NOTE — Telephone Encounter (Signed)
Pt wants to know if we received  the [facility] papers. Says they have to be returned before Dec. 30th 2019

## 2018-03-02 DIAGNOSIS — G8929 Other chronic pain: Secondary | ICD-10-CM | POA: Diagnosis not present

## 2018-03-02 DIAGNOSIS — R1084 Generalized abdominal pain: Secondary | ICD-10-CM | POA: Diagnosis not present

## 2018-03-03 ENCOUNTER — Telehealth: Payer: Self-pay | Admitting: Gastroenterology

## 2018-03-03 NOTE — Telephone Encounter (Signed)
Pt is calling stating she needs a form filled out for her Tiffany Rosales so she can get a refill. It needs to be approved before the end of December

## 2018-03-03 NOTE — Telephone Encounter (Signed)
Spoke with pt and informed her that we have received the patient assistance form for the Humira. I explained the we will complete the form and fax it back to [place].

## 2018-03-18 ENCOUNTER — Telehealth: Payer: Self-pay | Admitting: Gastroenterology

## 2018-03-18 NOTE — Telephone Encounter (Signed)
Ledell Noss is calling regarding pt rx they need clarification  cb [phone] option 4 then option 1

## 2018-03-26 NOTE — Telephone Encounter (Signed)
Spoke with [employer] representative and clarified pt prescription for Humira.

## 2018-04-14 ENCOUNTER — Encounter: Payer: Self-pay | Admitting: Gastroenterology

## 2018-04-14 ENCOUNTER — Ambulatory Visit (INDEPENDENT_AMBULATORY_CARE_PROVIDER_SITE_OTHER): Payer: Medicare HMO | Admitting: Gastroenterology

## 2018-04-14 ENCOUNTER — Other Ambulatory Visit: Payer: Self-pay

## 2018-04-14 VITALS — BP 132/92 | HR 123 | Ht 64.0 in | Wt 235.8 lb

## 2018-04-14 DIAGNOSIS — Z13820 Encounter for screening for osteoporosis: Secondary | ICD-10-CM

## 2018-04-14 DIAGNOSIS — K5100 Ulcerative (chronic) pancolitis without complications: Secondary | ICD-10-CM

## 2018-04-14 NOTE — Progress Notes (Signed)
Tiffany Bellows MD, MRCP(U.K) [address]  [place], [place] [address]  Main: [phone]  Fax: [phone]   Primary Care Physician: System, Provider Not In  Primary Gastroenterologist:  Dr. Jonathon Rosales   Chief Complaint  Patient presents with  . Follow-up    Ulcerative colitis    HPI: Tiffany Rosales is a 63 y.o. female    Summary of history : She was initially referred and seen by me on 01/01/17.She was previously seen at [facility]. H/o ulcerative colitis diagnosed in 05/2014 . Tried treatment with ASA and failed. Then was placed on Imuran but was stopped due to Dizziness and headaches.She was subsequently started on Humira , appears was started around 8/2017Treated with prednisone during flares. CT abdomen in 05/2014 showed diffuse mild colitis. Colonoscopy 07/2015 - moderately active pan colitis upto hepatic flexure.   01/30/17: MRE: no evidence of active inflammation of small or large bowel   Labs 03/2017: low vitamin D on replacement, TB quant ,HIVnegative, CMP-normal. Hep C was negative, not immune to hep B, hep A IGG not checked, Hb 12.5 with MCV 79 Colonoscopy 05/2017 - TI bx- normal, Rt colon bx - normal , 5 polyps x adenomas excised . No colitis in Tx colon , left clon ,sigmoid colon , rectum    Interval history7/24/19 -04/14/2018  09/2017 : TB quanteferon- negative DEXA was ordered last visit but not done.   At her last visit she was having some blood when she wiped.  Localized mild inflammation seen at the sigmoid colon with rest of the left colon appearing normal. Bx showed moderate active colitis- no HSV/CMV.   Takes Humira every week, no rectal bleeding. She has 1 bowel movement a day , not watery- everything feels good. She has had a hysterectomy    No NSAID's or smoking.    Current Outpatient Medications  Medication Sig Dispense Refill  . Acetaminophen 500 MG coapsule     . Adalimumab (HUMIRA) 40 MG/0.8ML PSKT Inject 40 mg into the  skin.    Marland Kitchen lisinopril (PRINIVIL,ZESTRIL) 10 MG tablet     . oxyCODONE-acetaminophen (PERCOCET) 7.5-325 MG tablet     . cephALEXin (KEFLEX) 500 MG capsule Take 1 capsule (500 mg total) by mouth 2 (two) times daily. (Patient not taking: Reported on 06/09/2017) 14 capsule 0  . ferrous gluconate (FERGON) 324 MG tablet Take 324 mg by mouth.    . gabapentin (NEURONTIN) 100 MG capsule 1 tab PO QHS inc to 3 tabs PO QHS over the next 10 days    . Multiple Vitamins-Minerals (MULTIVITAMIN WITH MINERALS) tablet Take 1 tablet by mouth daily.     No current facility-administered medications for this visit.     Allergies as of 04/14/2018 - Review Complete 04/14/2018  Allergen Reaction Noted  . Other Other (See Comments) 07/02/2011    ROS:  General: Negative for anorexia, weight loss, fever, chills, fatigue, weakness. ENT: Negative for hoarseness, difficulty swallowing , nasal congestion. CV: Negative for chest pain, angina, palpitations, dyspnea on exertion, peripheral edema.  Respiratory: Negative for dyspnea at rest, dyspnea on exertion, cough, sputum, wheezing.  GI: See history of present illness. GU:  Negative for dysuria, hematuria, urinary incontinence, urinary frequency, nocturnal urination.  Endo: Negative for unusual weight change.    Physical Examination:   BP (!) 132/92   Pulse (!) 123   Ht 5' 4"  (1.626 m)   Wt 235 lb 12.8 oz (107 kg)   BMI 40.47 kg/m  General: Well-nourished, well-developed in no acute distress.  Eyes: No icterus. Conjunctivae pink. Mouth: Oropharyngeal mucosa moist and pink , no lesions erythema or exudate. Lungs: Clear to auscultation bilaterally. Non-labored. Heart: Regular rate and rhythm, no murmurs rubs or gallops.  Abdomen: Bowel sounds are normal, nontender, nondistended, no hepatosplenomegaly or masses, no abdominal bruits or hernia , no rebound or guarding.   Extremities: No lower extremity edema. No clubbing or deformities. Neuro: Alert and oriented  x 3.  Grossly intact. Skin: Warm and dry, no jaundice.   Psych: Alert and cooperative, normal mood and affect.   Imaging Studies: No results found.  Assessment and Plan:   Tiffany Rosales is a 63 y.o. y/o female here to follow up forulcerative pan colitis diagnosed in early 2016 , failed ASA,Imuran and now on weekly Humira since 10/2015 every week  . Clinically appears to be in remission at this time. Updated on need for regular health maintenance.   Plan  1.Annual skin exam  2. No NSAID's 3. Annual flu shot done in September  , TDAP if due , pneumococcal vaccine if not had in past 5 years with PCP 4. Continue weekly humira. 5. Colonoscopy for colon cancer surveillance in 05/2020 6. CBC,CMP,DEXA bone scan , CRP, vitamin D     Dr Tiffany Bellows  MD,MRCP [facility]) Follow up in 6 months

## 2018-04-14 NOTE — Addendum Note (Signed)
Addended by: Dorethea Clan on: 04/14/2018 10:05 AM   Modules accepted: Orders

## 2018-04-15 LAB — COMPREHENSIVE METABOLIC PANEL
ALT: 13 IU/L (ref 0–32)
AST: 14 IU/L (ref 0–40)
Albumin/Globulin Ratio: 1.1 — ABNORMAL LOW (ref 1.2–2.2)
Albumin: 4 g/dL (ref 3.6–4.8)
Alkaline Phosphatase: 127 IU/L — ABNORMAL HIGH (ref 39–117)
BUN/Creatinine Ratio: 13 (ref 12–28)
BUN: 12 mg/dL (ref 8–27)
Bilirubin Total: 0.4 mg/dL (ref 0.0–1.2)
CO2: 20 mmol/L (ref 20–29)
Calcium: 10.6 mg/dL — ABNORMAL HIGH (ref 8.7–10.3)
Chloride: 111 mmol/L — ABNORMAL HIGH (ref 96–106)
Creatinine, Ser: 0.9 mg/dL (ref 0.57–1.00)
GFR calc Af Amer: 80 mL/min/{1.73_m2} (ref >59)
GFR calc non Af Amer: 69 mL/min/{1.73_m2} (ref >59)
Globulin, Total: 3.5 g/dL (ref 1.5–4.5)
Glucose: 86 mg/dL (ref 65–99)
Potassium: 4.6 mmol/L (ref 3.5–5.2)
Sodium: 144 mmol/L (ref 134–144)
Total Protein: 7.5 g/dL (ref 6.0–8.5)

## 2018-04-15 LAB — CBC WITH DIFFERENTIAL/PLATELET
Basophils Absolute: 0.1 10*3/uL (ref 0.0–0.2)
Basos: 1 %
EOS (ABSOLUTE): 0.2 10*3/uL (ref 0.0–0.4)
Eos: 3 %
Hematocrit: 39.5 % (ref 34.0–46.6)
Hemoglobin: 12.5 g/dL (ref 11.1–15.9)
Immature Grans (Abs): 0 10*3/uL (ref 0.0–0.1)
Immature Granulocytes: 0 %
Lymphocytes Absolute: 3 10*3/uL (ref 0.7–3.1)
Lymphs: 38 %
MCH: 23.5 pg — ABNORMAL LOW (ref 26.6–33.0)
MCHC: 31.6 g/dL (ref 31.5–35.7)
MCV: 74 fL — ABNORMAL LOW (ref 79–97)
Monocytes Absolute: 0.6 10*3/uL (ref 0.1–0.9)
Monocytes: 7 %
Neutrophils Absolute: 4.1 10*3/uL (ref 1.4–7.0)
Neutrophils: 51 %
Platelets: 206 10*3/uL (ref 150–450)
RBC: 5.31 x10E6/uL — ABNORMAL HIGH (ref 3.77–5.28)
RDW: 15.5 % — ABNORMAL HIGH (ref 11.7–15.4)
WBC: 8 10*3/uL (ref 3.4–10.8)

## 2018-04-15 LAB — HEPATITIS B SURFACE ANTIBODY,QUALITATIVE: Hep B Surface Ab, Qual: NONREACTIVE

## 2018-04-15 LAB — HEPATITIS B E ANTIBODY: Hep B E Ab: NEGATIVE

## 2018-04-15 LAB — C-REACTIVE PROTEIN: CRP: 3 mg/L (ref 0–10)

## 2018-04-15 LAB — HEPATITIS A ANTIBODY, TOTAL: Hep A Total Ab: NEGATIVE

## 2018-04-15 LAB — HEPATITIS B CORE ANTIBODY, TOTAL: HEP B C TOTAL AB: NEGATIVE

## 2018-04-29 ENCOUNTER — Other Ambulatory Visit: Payer: Self-pay

## 2018-04-29 ENCOUNTER — Telehealth: Payer: Self-pay

## 2018-04-29 ENCOUNTER — Ambulatory Visit (INDEPENDENT_AMBULATORY_CARE_PROVIDER_SITE_OTHER): Payer: Medicare HMO | Admitting: Gastroenterology

## 2018-04-29 DIAGNOSIS — Z23 Encounter for immunization: Secondary | ICD-10-CM | POA: Diagnosis not present

## 2018-04-29 DIAGNOSIS — R799 Abnormal finding of blood chemistry, unspecified: Secondary | ICD-10-CM | POA: Diagnosis not present

## 2018-04-29 DIAGNOSIS — D508 Other iron deficiency anemias: Secondary | ICD-10-CM | POA: Diagnosis not present

## 2018-04-29 DIAGNOSIS — K51011 Ulcerative (chronic) pancolitis with rectal bleeding: Secondary | ICD-10-CM

## 2018-04-29 NOTE — Telephone Encounter (Signed)
Spoke with pt and informed her of lab results and Dr. Georgeann Oppenheim instructions for pt to have additional lab tests and suggestion for pt to receive the Hep A/B vaccine. Pt agrees and plan to visit our clinic today for the additional labs and the first dose of Hep A/B vaccine.

## 2018-04-29 NOTE — Telephone Encounter (Signed)
-----   Message from Jonathon Bellows, MD sent at 04/26/2018 11:08 AM EST ----- Needs Hep A/B vaccine  Check Vitamin D, PTH, GGT,AMA as alkaline phos is elevated  Check Iron studies since MCV is low.

## 2018-04-29 NOTE — Progress Notes (Signed)
Pt  Is here to receive her first dose of Twinrix Hepatitis A/B vaccine. Pt is aware that the second dose is due in 4 weeks.

## 2018-04-30 LAB — VITAMIN D PNL(25-HYDRXY+1,25-DIHY)-BLD
Vit D, 1,25-Dihydroxy: 64.2 pg/mL (ref 19.9–79.3)
Vit D, 25-Hydroxy: 9.6 ng/mL — ABNORMAL LOW (ref 30.0–100.0)

## 2018-04-30 LAB — IRON,TIBC AND FERRITIN PANEL
Ferritin: 8 ng/mL — ABNORMAL LOW (ref 15–150)
IRON SATURATION: 6 % — AB (ref 15–55)
Iron: 26 ug/dL — ABNORMAL LOW (ref 27–139)
Total Iron Binding Capacity: 410 ug/dL (ref 250–450)
UIBC: 384 ug/dL — ABNORMAL HIGH (ref 118–369)

## 2018-04-30 LAB — PTH, INTACT AND CALCIUM
Calcium: 10.8 mg/dL — ABNORMAL HIGH (ref 8.7–10.3)
PTH: 75 pg/mL — ABNORMAL HIGH (ref 15–65)

## 2018-04-30 LAB — GAMMA GT: GGT: 17 IU/L (ref 0–60)

## 2018-05-02 LAB — MITOCHONDRIAL ANTIBODIES

## 2018-05-03 ENCOUNTER — Telehealth: Payer: Self-pay | Admitting: Gastroenterology

## 2018-05-03 NOTE — Telephone Encounter (Signed)
Melissa from [facility] left vm she states she called pt regarding  A referral from  Dr. Vicente Males  and the pt has no idea why the referral was placed please call Melissa at cb [phone] the patient is also expecting a call

## 2018-05-04 ENCOUNTER — Other Ambulatory Visit: Payer: Self-pay

## 2018-05-04 ENCOUNTER — Inpatient Hospital Stay: Payer: Medicare HMO | Attending: Oncology | Admitting: Oncology

## 2018-05-04 ENCOUNTER — Encounter: Payer: Self-pay | Admitting: Oncology

## 2018-05-04 VITALS — BP 130/92 | HR 119 | Temp 96.0°F | Resp 18 | Ht 64.0 in | Wt 238.6 lb

## 2018-05-04 DIAGNOSIS — D509 Iron deficiency anemia, unspecified: Secondary | ICD-10-CM

## 2018-05-04 DIAGNOSIS — E213 Hyperparathyroidism, unspecified: Secondary | ICD-10-CM | POA: Diagnosis not present

## 2018-05-04 DIAGNOSIS — L989 Disorder of the skin and subcutaneous tissue, unspecified: Secondary | ICD-10-CM | POA: Insufficient documentation

## 2018-05-04 DIAGNOSIS — I10 Essential (primary) hypertension: Secondary | ICD-10-CM | POA: Diagnosis not present

## 2018-05-04 DIAGNOSIS — D500 Iron deficiency anemia secondary to blood loss (chronic): Secondary | ICD-10-CM | POA: Diagnosis not present

## 2018-05-04 DIAGNOSIS — K51919 Ulcerative colitis, unspecified with unspecified complications: Secondary | ICD-10-CM | POA: Insufficient documentation

## 2018-05-04 NOTE — Telephone Encounter (Addendum)
Pt states she'd like her PCP to refer her to endocrinology. Pt states she has an appointment with her PCP in the next month. I explain that I will inform her PCP of her lab results and Dr. Georgeann Oppenheim suggestions.

## 2018-05-04 NOTE — Progress Notes (Signed)
Patient here for follow up. Patient has vision impairment. She lives with sister Karalina Tift, who accompanies her today.

## 2018-05-04 NOTE — Telephone Encounter (Signed)
-----   Message from Jonathon Bellows, MD sent at 04/30/2018  8:10 AM EST ----- Tiffany Rosales inform - has elevated PTH and high calcium- refer to endocrinology. Has low ferritin likely secondary to IBD- needs IV iron with Dr Tasia Catchings, needs EGD to evaluate for anemia- she has had a colonoscopy recently. Please inform and schedule.   Dr Jonathon Bellows MD,MRCP [facility]) Gastroenterology/Hepatology Pager: [phone]

## 2018-05-04 NOTE — Telephone Encounter (Signed)
Spoke with pt she is aware of her lab results and Dr. Georgeann Oppenheim instructions to refer pt to endocrine and hematology and for pt to have a EGD procedure. Pt agrees to all and has been scheduled.

## 2018-05-04 NOTE — Progress Notes (Signed)
Hematology/Oncology Consult note [facility] [phone] [phone]   Patient Care Team: Patient, No Pcp Per as PCP - General (General Practice)  REFERRING PROVIDER: Dr.Anna CHIEF COMPLAINTS/REASON FOR VISIT:  Evaluation of iron deficiency anemia  HISTORY OF PRESENTING ILLNESS:  Tiffany Rosales is a  63 y.o.  female with PMH listed below who was referred to me for evaluation of iron deficiency anemia Patient follows up with Dr. Vicente Males for ulcerative colitis.  On Humira every week. Recent lab work-up on 04/29/2018 showed ferritin 8, iron saturation 6, TIBC 410. CBC on 04/14/2018 showed hemoglobin 12.5, platelet count 206000, white count 8.  Normal differential. No previous iron panel to compare with.  Reviewed patient's previous labs, hemoglobin baseline 11.3-13.  Associated signs and symptoms: Patient reports chronic fatigue, denies SOB with exertion.  Denies weight loss, easy bruising. She has vision impairment so not able to report any history of hematochezia or hematuria.  Context:  History of iron deficiency: Denies Pica: Craves ice chips Last endoscopy: Ulcerative colitis.  Last flexible sigmoidoscopy on 10/29/2017 showed mild localized inflammation. Fatigue: Yes.  SOB: deneis Accompanied by sister to clinic today.  Sister mentioned she noticed a dark skin spot on patient's right ankle.   Review of Systems  Constitutional: Positive for fatigue. Negative for appetite change, chills and fever.  HENT:   Negative for hearing loss and voice change.        Vision impairment  Eyes: Negative for eye problems.  Respiratory: Negative for chest tightness and cough.   Cardiovascular: Negative for chest pain.  Gastrointestinal: Negative for abdominal distention, abdominal pain and blood in stool.  Endocrine: Negative for hot flashes.  Genitourinary: Negative for difficulty urinating and frequency.   Musculoskeletal: Negative for arthralgias.   Skin: Negative for itching and rash.  Neurological: Negative for extremity weakness.  Hematological: Negative for adenopathy.  Psychiatric/Behavioral: Negative for confusion.    MEDICAL HISTORY:  Past Medical History:  Diagnosis Date  . Colitis   . Detached retina   . Hypertension   . Vision impairment     SURGICAL HISTORY: Past Surgical History:  Procedure Laterality Date  . ABDOMINAL HYSTERECTOMY    . COLONOSCOPY WITH PROPOFOL N/A 06/09/2017   Procedure: COLONOSCOPY WITH PROPOFOL;  Surgeon: Jonathon Bellows, MD;  Location: [facility] ENDOSCOPY;  Service: Gastroenterology;  Laterality: N/A;  . EYE SURGERY     1982  . FLEXIBLE SIGMOIDOSCOPY N/A 10/29/2017   Procedure: FLEXIBLE SIGMOIDOSCOPY;  Surgeon: Jonathon Bellows, MD;  Location: [facility] ENDOSCOPY;  Service: Gastroenterology;  Laterality: N/A;    SOCIAL HISTORY: Social History   Socioeconomic History  . Marital status: Single    Spouse name: Not on file  . Number of children: Not on file  . Years of education: Not on file  . Highest education level: Not on file  Occupational History  . Not on file  Social Needs  . Financial resource strain: Not on file  . Food insecurity:    Worry: Not on file    Inability: Not on file  . Transportation needs:    Medical: Not on file    Non-medical: Not on file  Tobacco Use  . Smoking status: Never Smoker  . Smokeless tobacco: Never Used  Substance and Sexual Activity  . Alcohol use: No  . Drug use: No  . Sexual activity: Never  Lifestyle  . Physical activity:    Days per week: Not on file    Minutes per session: Not on file  .  Stress: Not on file  Relationships  . Social connections:    Talks on phone: Not on file    Gets together: Not on file    Attends religious service: Not on file    Active member of club or organization: Not on file    Attends meetings of clubs or organizations: Not on file    Relationship status: Not on file  . Intimate partner violence:    Fear of current or  ex partner: Not on file    Emotionally abused: Not on file    Physically abused: Not on file    Forced sexual activity: Not on file  Other Topics Concern  . Not on file  Social History Narrative  . Not on file    FAMILY HISTORY: Family History  Problem Relation Age of Onset  . Breast cancer Mother   . Kidney disease Mother   . Hypertension Sister   . Hypertension Brother   . Breast cancer Cousin   . Breast cancer Cousin     ALLERGIES:  is allergic to other.  MEDICATIONS:  Current Outpatient Medications  Medication Sig Dispense Refill  . Acetaminophen 500 MG coapsule     . Adalimumab (HUMIRA) 40 MG/0.8ML PSKT Inject 40 mg into the skin.    Marland Kitchen lisinopril (PRINIVIL,ZESTRIL) 10 MG tablet     . oxyCODONE-acetaminophen (PERCOCET) 7.5-325 MG tablet     . ferrous gluconate (FERGON) 324 MG tablet Take 324 mg by mouth.    . gabapentin (NEURONTIN) 100 MG capsule 1 tab PO QHS inc to 3 tabs PO QHS over the next 10 days    . Multiple Vitamins-Minerals (MULTIVITAMIN WITH MINERALS) tablet Take 1 tablet by mouth daily.     No current facility-administered medications for this visit.      PHYSICAL EXAMINATION: ECOG PERFORMANCE STATUS: 1 - Symptomatic but completely ambulatory Vitals:   05/04/18 1432  BP: (!) 130/92  Pulse: (!) 119  Resp: 18  Temp: (!) 96 F (35.6 C)  SpO2: 95%   Filed Weights   05/04/18 1432  Weight: 238 lb 9.6 oz (108.2 kg)    Physical Exam Constitutional:      General: She is not in acute distress. HENT:     Head: Normocephalic and atraumatic.  Eyes:     Comments: Chronic vision impairment  Neck:     Musculoskeletal: Normal range of motion and neck supple.  Cardiovascular:     Rate and Rhythm: Normal rate and regular rhythm.     Heart sounds: Normal heart sounds.  Pulmonary:     Effort: Pulmonary effort is normal. No respiratory distress.     Breath sounds: No wheezing.  Abdominal:     General: Bowel sounds are normal. There is no distension.      Palpations: Abdomen is soft. There is no mass.     Tenderness: There is no abdominal tenderness.  Musculoskeletal: Normal range of motion.        General: No deformity.  Skin:    General: Skin is warm and dry.     Findings: No erythema or rash.     Comments: Right ankle skin dark discolorations lesion  Neurological:     Mental Status: She is alert and oriented to person, place, and time.     Cranial Nerves: No cranial nerve deficit.     Coordination: Coordination normal.  Psychiatric:        Mood and Affect: Mood normal.       CMP Latest  Ref Rng & Units 04/29/2018  Glucose 65 - 99 mg/dL -  BUN 8 - 27 mg/dL -  Creatinine 0.57 - 1.00 mg/dL -  Sodium 134 - 144 mmol/L -  Potassium 3.5 - 5.2 mmol/L -  Chloride 96 - 106 mmol/L -  CO2 20 - 29 mmol/L -  Calcium 8.7 - 10.3 mg/dL 10.8(H)  Total Protein 6.0 - 8.5 g/dL -  Total Bilirubin 0.0 - 1.2 mg/dL -  Alkaline Phos 39 - 117 IU/L -  AST 0 - 40 IU/L -  ALT 0 - 32 IU/L -   CBC Latest Ref Rng & Units 04/14/2018  WBC 3.4 - 10.8 x10E3/uL 8.0  Hemoglobin 11.1 - 15.9 g/dL 12.5  Hematocrit 34.0 - 46.6 % 39.5  Platelets 150 - 450 x10E3/uL 206     LABORATORY DATA:  I have reviewed the data as listed Lab Results  Component Value Date   WBC 8.0 04/14/2018   HGB 12.5 04/14/2018   HCT 39.5 04/14/2018   MCV 74 (L) 04/14/2018   PLT 206 04/14/2018   Recent Labs    10/22/17 1111 04/14/18 1103 04/29/18 1419  NA 141 144  --   K 3.9 4.6  --   CL 113* 111*  --   CO2 22 20  --   GLUCOSE 98 86  --   BUN 12 12  --   CREATININE 0.82 0.90  --   CALCIUM 10.3 10.6* 10.8*  GFRNONAA >60 69  --   GFRAA >60 80  --   PROT 7.7 7.5  --   ALBUMIN 3.8 4.0  --   AST 26 14  --   ALT 24 13  --   ALKPHOS 109 127*  --   BILITOT 1.1 0.4  --    Iron/TIBC/Ferritin/ %Sat    Component Value Date/Time   IRON 26 (L) 04/29/2018 1417   TIBC 410 04/29/2018 1417   FERRITIN 8 (L) 04/29/2018 1417   IRONPCTSAT 6 (LL) 04/29/2018 1417     No results  found.    ASSESSMENT & PLAN:  1. Iron deficiency anemia due to chronic blood loss   2. Ulcerative colitis with complication, unspecified location ([place])   3. Hyperparathyroidism ([place])   4. Skin lesion of foot   #Iron deficiency due to ulcerative colitis Labs are reviewed and discussed with patient. Consistent with iron deficiency anemia. Plan IV iron with Venofer 236m weekly x 4 doses. Allergy reactions/infusion reaction including anaphylactic reaction discussed with patient. Other side effects include but not limited to high blood pressure, skin rash, weight gain, leg swelling, etc. Patient voices understanding and willing to proceed.  #Ulcerative colitis, continue follow-up with Dr. AVicente Males  Currently on Humira #Elevated PTH and calcium level, likely primary hyperparathyroidism.  [facility]. #Skin lesion, recommend patient to talk to primary care physician and obtain dermatology evaluation. Orders Placed This Encounter  Procedures  . CBC with Differential/Platelet    Standing Status:   Future    Standing Expiration Date:   05/05/2019  . Ferritin    Standing Status:   Future    Standing Expiration Date:   05/05/2019  . Iron and TIBC    Standing Status:   Future    Standing Expiration Date:   05/05/2019    All questions were answered. The patient knows to call the clinic with any problems questions or concerns.  Return of visit:8 weeks with repeat CBC, iron TIBC ferritin, +/-  IV Venofer. Thank you for this kind referral and the opportunity to participate in the care of this patient. A copy of today's note is routed to referring provider  Total face to face encounter time for this patient visit was 30mn. >50% of the time was  spent in counseling and coordination of care.    ZEarlie Server MD, PhD Hematology Oncology [facility]at AAur[facility] All[facility] Medical CenterPager- [phone]/06/2018

## 2018-05-10 ENCOUNTER — Encounter: Payer: Self-pay | Admitting: *Deleted

## 2018-05-10 ENCOUNTER — Telehealth: Payer: Self-pay | Admitting: Gastroenterology

## 2018-05-10 ENCOUNTER — Inpatient Hospital Stay: Payer: Medicare HMO

## 2018-05-10 VITALS — BP 134/84 | HR 110 | Resp 20

## 2018-05-10 DIAGNOSIS — I10 Essential (primary) hypertension: Secondary | ICD-10-CM | POA: Diagnosis not present

## 2018-05-10 DIAGNOSIS — E213 Hyperparathyroidism, unspecified: Secondary | ICD-10-CM | POA: Diagnosis not present

## 2018-05-10 DIAGNOSIS — D500 Iron deficiency anemia secondary to blood loss (chronic): Secondary | ICD-10-CM

## 2018-05-10 DIAGNOSIS — L989 Disorder of the skin and subcutaneous tissue, unspecified: Secondary | ICD-10-CM | POA: Diagnosis not present

## 2018-05-10 DIAGNOSIS — K51919 Ulcerative colitis, unspecified with unspecified complications: Secondary | ICD-10-CM | POA: Diagnosis not present

## 2018-05-10 MED ORDER — IRON SUCROSE 20 MG/ML IV SOLN
200.0000 mg | Freq: Once | INTRAVENOUS | Status: AC
  Administered 2018-05-10: 200 mg via INTRAVENOUS
  Filled 2018-05-10: qty 10

## 2018-05-10 MED ORDER — SODIUM CHLORIDE 0.9 % IV SOLN
Freq: Once | INTRAVENOUS | Status: AC
  Administered 2018-05-10: 14:00:00 via INTRAVENOUS
  Filled 2018-05-10: qty 250

## 2018-05-10 NOTE — Telephone Encounter (Signed)
Pt is calling she has a procedure 05/11/18 and she has not received  Her instructions  Please call her to give her the details on her procedure. Pt was given the endoscopy number to check on time between 1-3 pt states she will be having irion infusion during that time and will see if her nephew can call for her time

## 2018-05-11 ENCOUNTER — Encounter: Payer: Self-pay | Admitting: Student

## 2018-05-11 ENCOUNTER — Other Ambulatory Visit: Payer: Self-pay

## 2018-05-11 ENCOUNTER — Encounter
Admission: RE | Disposition: A | Discharge status: Home / Self Care | Payer: Self-pay | Source: Home / Self Care | Attending: Gastroenterology

## 2018-05-11 ENCOUNTER — Ambulatory Visit: Payer: Medicare HMO | Admitting: Certified Registered"

## 2018-05-11 ENCOUNTER — Ambulatory Visit
Admission: RE | Admit: 2018-05-11 | Discharge: 2018-05-11 | Disposition: A | Discharge status: Home / Self Care | Payer: Medicare HMO | Attending: Gastroenterology | Admitting: Gastroenterology

## 2018-05-11 DIAGNOSIS — Z8711 Personal history of peptic ulcer disease: Secondary | ICD-10-CM | POA: Insufficient documentation

## 2018-05-11 DIAGNOSIS — G8929 Other chronic pain: Secondary | ICD-10-CM | POA: Diagnosis not present

## 2018-05-11 DIAGNOSIS — Z79899 Other long term (current) drug therapy: Secondary | ICD-10-CM | POA: Diagnosis not present

## 2018-05-11 DIAGNOSIS — K2930 Chronic superficial gastritis without bleeding: Secondary | ICD-10-CM | POA: Diagnosis not present

## 2018-05-11 DIAGNOSIS — K2980 Duodenitis without bleeding: Secondary | ICD-10-CM | POA: Insufficient documentation

## 2018-05-11 DIAGNOSIS — K3189 Other diseases of stomach and duodenum: Secondary | ICD-10-CM | POA: Diagnosis not present

## 2018-05-11 DIAGNOSIS — D509 Iron deficiency anemia, unspecified: Secondary | ICD-10-CM

## 2018-05-11 DIAGNOSIS — K2950 Unspecified chronic gastritis without bleeding: Secondary | ICD-10-CM | POA: Diagnosis not present

## 2018-05-11 DIAGNOSIS — R1084 Generalized abdominal pain: Secondary | ICD-10-CM | POA: Diagnosis not present

## 2018-05-11 DIAGNOSIS — I10 Essential (primary) hypertension: Secondary | ICD-10-CM | POA: Diagnosis not present

## 2018-05-11 HISTORY — DX: Anemia, unspecified: D64.9

## 2018-05-11 HISTORY — PX: ESOPHAGOGASTRODUODENOSCOPY (EGD) WITH PROPOFOL: SHX5813

## 2018-05-11 SURGERY — ESOPHAGOGASTRODUODENOSCOPY (EGD) WITH PROPOFOL
Anesthesia: General

## 2018-05-11 MED ORDER — FENTANYL CITRATE (PF) 100 MCG/2ML IJ SOLN
INTRAMUSCULAR | Status: DC | PRN
  Administered 2018-05-11: 25 ug via INTRAVENOUS

## 2018-05-11 MED ORDER — LIDOCAINE HCL (CARDIAC) PF 100 MG/5ML IV SOSY
PREFILLED_SYRINGE | INTRAVENOUS | Status: DC | PRN
  Administered 2018-05-11: 100 mg via INTRATRACHEAL

## 2018-05-11 MED ORDER — SODIUM CHLORIDE 0.9 % IV SOLN
INTRAVENOUS | Status: DC
  Administered 2018-05-11: 09:00:00 via INTRAVENOUS

## 2018-05-11 MED ORDER — PROPOFOL 500 MG/50ML IV EMUL
INTRAVENOUS | Status: DC | PRN
  Administered 2018-05-11: 100 ug/kg/min via INTRAVENOUS

## 2018-05-11 MED ORDER — PROPOFOL 10 MG/ML IV BOLUS
INTRAVENOUS | Status: AC
  Filled 2018-05-11: qty 40

## 2018-05-11 MED ORDER — FENTANYL CITRATE (PF) 100 MCG/2ML IJ SOLN
INTRAMUSCULAR | Status: AC
  Filled 2018-05-11: qty 2

## 2018-05-11 MED ORDER — PROPOFOL 10 MG/ML IV BOLUS
INTRAVENOUS | Status: DC | PRN
  Administered 2018-05-11: 40 mg via INTRAVENOUS
  Administered 2018-05-11: 50 mg via INTRAVENOUS

## 2018-05-11 NOTE — H&P (Signed)
Jonathon Bellows, MD [address], [place], [place], [place], [address] [address], [place], [place], [place], [address] Phone: [phone]  Fax: [phone]  Primary Care Physician:  System, Pcp Not In   Pre-Procedure History & Physical: HPI:  Tiffany Rosales is a 63 y.o. female is here for an endoscopy    Past Medical History:  Diagnosis Date  . Colitis   . Detached retina   . Hypertension   . Vision impairment     Past Surgical History:  Procedure Laterality Date  . ABDOMINAL HYSTERECTOMY    . COLONOSCOPY WITH PROPOFOL N/A 06/09/2017   Procedure: COLONOSCOPY WITH PROPOFOL;  Surgeon: Jonathon Bellows, MD;  Location: [facility] ENDOSCOPY;  Service: Gastroenterology;  Laterality: N/A;  . EYE SURGERY     1982  . FLEXIBLE SIGMOIDOSCOPY N/A 10/29/2017   Procedure: FLEXIBLE SIGMOIDOSCOPY;  Surgeon: Jonathon Bellows, MD;  Location: [facility] ENDOSCOPY;  Service: Gastroenterology;  Laterality: N/A;    Prior to Admission medications   Medication Sig Start Date End Date Taking? Authorizing Provider  Adalimumab (HUMIRA) 40 MG/0.8ML PSKT Inject 40 mg into the skin. 07/18/16  Yes [provider]  lisinopril (PRINIVIL,ZESTRIL) 10 MG tablet  12/21/16  Yes [provider]  oxyCODONE-acetaminophen (PERCOCET) 7.5-325 MG tablet  12/05/16  Yes [provider]  Acetaminophen 500 MG coapsule  06/04/17   [provider]  ferrous gluconate (FERGON) 324 MG tablet Take 324 mg by mouth. 04/23/16   [provider]  gabapentin (NEURONTIN) 100 MG capsule 1 tab PO QHS inc to 3 tabs PO QHS over the next 10 days 07/02/17   [provider]  Multiple Vitamins-Minerals (MULTIVITAMIN WITH MINERALS) tablet Take 1 tablet by mouth daily.    [provider]    Allergies as of 05/04/2018 - Review Complete 05/04/2018  Allergen Reaction Noted  . Other Other (See Comments) 07/02/2011    Family History  Problem Relation Age of Onset  . Breast cancer Mother   . Kidney  disease Mother   . Hypertension Sister   . Hypertension Brother   . Breast cancer Cousin   . Breast cancer Cousin     Social History   Socioeconomic History  . Marital status: Single    Spouse name: Not on file  . Number of children: Not on file  . Years of education: Not on file  . Highest education level: Not on file  Occupational History  . Not on file  Social Needs  . Financial resource strain: Not on file  . Food insecurity:    Worry: Not on file    Inability: Not on file  . Transportation needs:    Medical: Not on file    Non-medical: Not on file  Tobacco Use  . Smoking status: Never Smoker  . Smokeless tobacco: Never Used  Substance and Sexual Activity  . Alcohol use: No  . Drug use: No  . Sexual activity: Never  Lifestyle  . Physical activity:    Days per week: Not on file    Minutes per session: Not on file  . Stress: Not on file  Relationships  . Social connections:    Talks on phone: Not on file    Gets together: Not on file    Attends religious service: Not on file    Active member of club or organization: Not on file    Attends meetings of clubs or organizations: Not on file    Relationship status: Not on file  .  Intimate partner violence:    Fear of current or ex partner: Not on file    Emotionally abused: Not on file    Physically abused: Not on file    Forced sexual activity: Not on file  Other Topics Concern  . Not on file  Social History Narrative  . Not on file    Review of Systems: See HPI, otherwise negative ROS  Physical Exam: There were no vitals taken for this visit. General:   Alert,  pleasant and cooperative in NAD Head:  Normocephalic and atraumatic. Neck:  Supple; no masses or thyromegaly. Lungs:  Clear throughout to auscultation, normal respiratory effort.    Heart:  +S1, +S2, Regular rate and rhythm, No edema. Abdomen:  Soft, nontender and nondistended. Normal bowel sounds, without guarding, and without rebound.     Neurologic:  Alert and  oriented x4;  grossly normal neurologically.  Impression/Plan: Tiffany Rosales is here for an endoscopy  to be performed for  evaluation of iron deficiency anemia    Risks, benefits, limitations, and alternatives regarding endoscopy have been reviewed with the patient.  Questions have been answered.  All parties agreeable.   Jonathon Bellows, MD  05/11/2018, 8:46 AM

## 2018-05-11 NOTE — Transfer of Care (Signed)
Immediate Anesthesia Transfer of Care Note  Patient: Tiffany Rosales  Procedure(s) Performed: ESOPHAGOGASTRODUODENOSCOPY (EGD) WITH PROPOFOL (N/A )  Patient Location: PACU  Anesthesia Type:General  Level of Consciousness: awake and patient cooperative  Airway & Oxygen Therapy: Patient Spontanous Breathing and Patient connected to nasal cannula oxygen  Post-op Assessment: Report given to RN  Post vital signs: stable  Last Vitals:  Vitals Value Taken Time  BP    Temp 36.4 C 05/11/2018  9:26 AM  Pulse    Resp    SpO2      Last Pain:  Vitals:   05/11/18 0926  TempSrc: Tympanic  PainSc:          Complications: No apparent anesthesia complications

## 2018-05-11 NOTE — Anesthesia Preprocedure Evaluation (Signed)
Anesthesia Evaluation  Patient identified by MRN, date of birth, ID band Patient awake    Reviewed: Allergy & Precautions, H&P , NPO status , Patient's Chart, lab work & pertinent test results  History of Anesthesia Complications Negative for: history of anesthetic complications  Airway Mallampati: III  TM Distance: <3 FB Neck ROM: limited    Dental  (+) Chipped, Poor Dentition, Missing   Pulmonary neg pulmonary ROS, neg shortness of breath,           Cardiovascular hypertension, (-) angina(-) Past MI and (-) DOE      Neuro/Psych  Headaches, negative psych ROS   GI/Hepatic Neg liver ROS, PUD, neg GERD  ,  Endo/Other  negative endocrine ROS  Renal/GU negative Renal ROS  negative genitourinary   Musculoskeletal   Abdominal   Peds  Hematology negative hematology ROS (+)   Anesthesia Other Findings Past Medical History: No date: Anemia No date: Colitis No date: Detached retina No date: Hypertension No date: Vision impairment  Past Surgical History: No date: ABDOMINAL HYSTERECTOMY 06/09/2017: COLONOSCOPY WITH PROPOFOL; N/A     Comment:  Procedure: COLONOSCOPY WITH PROPOFOL;  Surgeon: Jonathon Bellows, MD;  Location: [facility] ENDOSCOPY;  Service:               Gastroenterology;  Laterality: N/A; No date: EYE SURGERY     Comment:  1982 10/29/2017: FLEXIBLE SIGMOIDOSCOPY; N/A     Comment:  Procedure: FLEXIBLE SIGMOIDOSCOPY;  Surgeon: Jonathon Bellows, MD;  Location: [facility] ENDOSCOPY;  Service:               Gastroenterology;  Laterality: N/A;  BMI    Body Mass Index:  36.96 kg/m      Reproductive/Obstetrics negative OB ROS                             Anesthesia Physical Anesthesia Plan  ASA: III  Anesthesia Plan: General   Post-op Pain Management:    Induction: Intravenous  PONV Risk Score and Plan: Propofol infusion and TIVA  Airway Management Planned: Natural  Airway and Nasal Cannula  Additional Equipment:   Intra-op Plan:   Post-operative Plan:   Informed Consent: I have reviewed the patients History and Physical, chart, labs and discussed the procedure including the risks, benefits and alternatives for the proposed anesthesia with the patient or authorized representative who has indicated his/her understanding and acceptance.     Dental Advisory Given  Plan Discussed with: Anesthesiologist, CRNA and Surgeon  Anesthesia Plan Comments: (Patient consented for risks of anesthesia including but not limited to:  - adverse reactions to medications - risk of intubation if required - damage to teeth, lips or other oral mucosa - sore throat or hoarseness - Damage to heart, brain, lungs or loss of life  Patient voiced understanding.)        Anesthesia Quick Evaluation

## 2018-05-11 NOTE — Op Note (Signed)
[facility]astro[facility] Patient Name: Tiffany Rosales Procedure Date: 05/11/2018 8:39 AM MRN: [id number] Account #: [id number] Date of Birth: 1955-04-07 Admit Type: Outpatient Age: 63 Room: [facility] ENDO ROOM 1 Gender: Female Note Status: Finalized Procedure:            Upper GI endoscopy Indications:          Iron deficiency anemia Providers:            Jonathon Bellows MD, MD Referring MD:         No Local Md, MD (Referring MD) Medicines:            Monitored Anesthesia Care Complications:        No immediate complications. Procedure:            Pre-Anesthesia Assessment:                       - Prior to the procedure, a History and Physical was                        performed, and patient medications, allergies and                        sensitivities were reviewed. The patient's tolerance of                        previous anesthesia was reviewed.                       - The risks and benefits of the procedure and the                        sedation options and risks were discussed with the                        patient. All questions were answered and informed                        consent was obtained.                       - ASA Grade Assessment: III - A patient with severe                        systemic disease.                       After obtaining informed consent, the endoscope was                        passed under direct vision. Throughout the procedure,                        the patient's blood pressure, pulse, and oxygen                        saturations were monitored continuously. The Endoscope                        was introduced through the mouth, and advanced to the  third part of duodenum. The upper GI endoscopy was                        accomplished with ease. The patient tolerated the                        procedure well. Findings:      The esophagus was normal.      Patchy moderately erythematous mucosa  without bleeding was found on the       greater curvature of the stomach. Biopsies were taken with a cold       forceps for histology.      The examined duodenum was normal. Biopsies for histology were taken with       a cold forceps for evaluation of celiac disease.      The cardia and gastric fundus were normal on retroflexion. Impression:           - Normal esophagus.                       - Erythematous mucosa in the greater curvature.                        Biopsied.                       - Normal examined duodenum. Biopsied. Recommendation:       - Discharge patient to home (with escort).                       - Resume previous diet.                       - Continue present medications.                       - Await pathology results.                       - Return to my office in 6 weeks. Procedure Code(s):    --- Professional ---                       [phone], Esophagogastroduodenoscopy, flexible, transoral;                        with biopsy, single or multiple Diagnosis Code(s):    --- Professional ---                       K31.89, Other diseases of stomach and duodenum                       D50.9, Iron deficiency anemia, unspecified CPT copyright 2018 American Medical Association. All rights reserved. The codes documented in this report are preliminary and upon coder review may  be revised to meet current compliance requirements. Jonathon Bellows, MD Jonathon Bellows MD, MD 05/11/2018 9:18:21 AM This report has been signed electronically. Number of Addenda: 0 Note Initiated On: 05/11/2018 8:39 AM      [facility]

## 2018-05-11 NOTE — Anesthesia Post-op Follow-up Note (Signed)
Anesthesia QCDR form completed.        

## 2018-05-11 NOTE — Anesthesia Postprocedure Evaluation (Signed)
Anesthesia Post Note  Patient: Tiffany Rosales  Procedure(s) Performed: ESOPHAGOGASTRODUODENOSCOPY (EGD) WITH PROPOFOL (N/A )  Patient location during evaluation: Endoscopy Anesthesia Type: General Level of consciousness: awake and alert Pain management: pain level controlled Vital Signs Assessment: post-procedure vital signs reviewed and stable Respiratory status: spontaneous breathing, nonlabored ventilation, respiratory function stable and patient connected to nasal cannula oxygen Cardiovascular status: blood pressure returned to baseline and stable Postop Assessment: no apparent nausea or vomiting Anesthetic complications: no     Last Vitals:  Vitals:   05/11/18 0926 05/11/18 0936  BP: 99/82 (!) 122/93  Pulse:  94  Resp: 20 20  Temp: (!) 36.4 C   SpO2: 97% 98%    Last Pain:  Vitals:   05/11/18 0936  TempSrc:   PainSc: 0-No pain                 Precious Haws Jaselynn Tamas

## 2018-05-12 ENCOUNTER — Encounter: Payer: Self-pay | Admitting: Gastroenterology

## 2018-05-13 LAB — SURGICAL PATHOLOGY

## 2018-05-17 ENCOUNTER — Inpatient Hospital Stay: Payer: Medicare HMO

## 2018-05-17 VITALS — BP 149/90 | HR 105 | Temp 98.0°F | Resp 20

## 2018-05-17 DIAGNOSIS — K51919 Ulcerative colitis, unspecified with unspecified complications: Secondary | ICD-10-CM | POA: Diagnosis not present

## 2018-05-17 DIAGNOSIS — I10 Essential (primary) hypertension: Secondary | ICD-10-CM | POA: Diagnosis not present

## 2018-05-17 DIAGNOSIS — D500 Iron deficiency anemia secondary to blood loss (chronic): Secondary | ICD-10-CM

## 2018-05-17 DIAGNOSIS — L989 Disorder of the skin and subcutaneous tissue, unspecified: Secondary | ICD-10-CM | POA: Diagnosis not present

## 2018-05-17 DIAGNOSIS — E213 Hyperparathyroidism, unspecified: Secondary | ICD-10-CM | POA: Diagnosis not present

## 2018-05-17 MED ORDER — SODIUM CHLORIDE 0.9 % IV SOLN
Freq: Once | INTRAVENOUS | Status: AC
  Administered 2018-05-17: 14:00:00 via INTRAVENOUS
  Filled 2018-05-17: qty 250

## 2018-05-17 MED ORDER — IRON SUCROSE 20 MG/ML IV SOLN
200.0000 mg | Freq: Once | INTRAVENOUS | Status: AC
  Administered 2018-05-17: 200 mg via INTRAVENOUS
  Filled 2018-05-17: qty 10

## 2018-05-18 ENCOUNTER — Telehealth: Payer: Self-pay

## 2018-05-18 ENCOUNTER — Other Ambulatory Visit: Payer: Self-pay

## 2018-05-18 MED ORDER — CLARITHROMYCIN 500 MG PO TABS: 500.0000 mg | ORAL_TABLET | Freq: Two times a day (BID) | ORAL | 0 refills | Status: AC

## 2018-05-18 MED ORDER — AMOXICILLIN 500 MG PO TABS: 1000.0000 mg | ORAL_TABLET | Freq: Three times a day (TID) | ORAL | 0 refills | Status: AC

## 2018-05-18 MED ORDER — OMEPRAZOLE 20 MG PO CPDR: 20.0000 mg | DELAYED_RELEASE_CAPSULE | Freq: Two times a day (BID) | ORAL | 0 refills | Status: DC

## 2018-05-18 NOTE — Telephone Encounter (Signed)
-----   Message from Jonathon Bellows, MD sent at 05/13/2018  2:16 PM EST ----- H pylori gastritis.   Suggest clarithromycin 500 mg PO BID, amoxicillin 1 gram TID, omeprazole 20 mg BID all for 14 days.  , check for penicillin allergy and will need repeat H pylori stool antigen to check for eradication after .

## 2018-05-18 NOTE — Telephone Encounter (Signed)
Spoke with pt and informed her of biopsy results and Dr. Georgeann Oppenheim directions for pt to commence on medication for treatment of H pylori. Pt is aware that we have sent the prescriptions to her preferred pharmacy.

## 2018-05-21 ENCOUNTER — Telehealth: Payer: Self-pay | Admitting: *Deleted

## 2018-05-21 NOTE — Telephone Encounter (Signed)
Patient informed of doctor response

## 2018-05-21 NOTE — Telephone Encounter (Signed)
Patient called to report that she is on 2 antibiotics for an infection and wants to know if that will interfere with her iron infusions. Please advise

## 2018-05-21 NOTE — Telephone Encounter (Signed)
Should be fine.

## 2018-05-24 ENCOUNTER — Inpatient Hospital Stay: Payer: Medicare HMO

## 2018-05-24 VITALS — BP 135/86 | HR 94 | Resp 20

## 2018-05-24 DIAGNOSIS — L989 Disorder of the skin and subcutaneous tissue, unspecified: Secondary | ICD-10-CM | POA: Diagnosis not present

## 2018-05-24 DIAGNOSIS — D500 Iron deficiency anemia secondary to blood loss (chronic): Secondary | ICD-10-CM | POA: Diagnosis not present

## 2018-05-24 DIAGNOSIS — E213 Hyperparathyroidism, unspecified: Secondary | ICD-10-CM | POA: Diagnosis not present

## 2018-05-24 DIAGNOSIS — I10 Essential (primary) hypertension: Secondary | ICD-10-CM | POA: Diagnosis not present

## 2018-05-24 DIAGNOSIS — K51919 Ulcerative colitis, unspecified with unspecified complications: Secondary | ICD-10-CM | POA: Diagnosis not present

## 2018-05-24 MED ORDER — IRON SUCROSE 20 MG/ML IV SOLN
200.0000 mg | Freq: Once | INTRAVENOUS | Status: AC
  Administered 2018-05-24: 200 mg via INTRAVENOUS

## 2018-05-24 MED ORDER — SODIUM CHLORIDE 0.9 % IV SOLN
Freq: Once | INTRAVENOUS | Status: AC
  Administered 2018-05-24: 14:00:00 via INTRAVENOUS
  Filled 2018-05-24: qty 250

## 2018-05-26 ENCOUNTER — Other Ambulatory Visit: Payer: Self-pay

## 2018-05-27 ENCOUNTER — Telehealth: Payer: Self-pay | Admitting: Gastroenterology

## 2018-05-27 NOTE — Telephone Encounter (Signed)
Pt is calling to fins out if she can do her Hep Injection at her Primary care at [facility] she has an apt there Tuesday  I informed her to contact  Them to find out if they carry the injection she will call us back

## 2018-05-28 NOTE — Telephone Encounter (Signed)
Pt checked with [facility] and will receive her Heb B vaccine there on Tuesday

## 2018-05-31 ENCOUNTER — Inpatient Hospital Stay: Payer: Medicare HMO | Attending: Oncology

## 2018-05-31 ENCOUNTER — Ambulatory Visit: Payer: Self-pay

## 2018-05-31 VITALS — BP 126/92 | HR 98 | Temp 95.3°F | Resp 20

## 2018-05-31 DIAGNOSIS — D500 Iron deficiency anemia secondary to blood loss (chronic): Secondary | ICD-10-CM | POA: Diagnosis not present

## 2018-05-31 DIAGNOSIS — K51919 Ulcerative colitis, unspecified with unspecified complications: Secondary | ICD-10-CM | POA: Insufficient documentation

## 2018-05-31 MED ORDER — IRON SUCROSE 20 MG/ML IV SOLN
200.0000 mg | Freq: Once | INTRAVENOUS | Status: AC
  Administered 2018-05-31: 200 mg via INTRAVENOUS
  Filled 2018-05-31: qty 10

## 2018-05-31 MED ORDER — SODIUM CHLORIDE 0.9 % IV SOLN
Freq: Once | INTRAVENOUS | Status: AC
  Administered 2018-05-31: 14:00:00 via INTRAVENOUS
  Filled 2018-05-31: qty 250

## 2018-05-31 NOTE — Telephone Encounter (Signed)
Spoke with pt and explained to her that she would need to receive the Hepatitis A vaccine as well as the Hep B vaccine. Pt understands and plans to contact her PCP to ensure they are able to give both vaccines.

## 2018-06-01 DIAGNOSIS — Z789 Other specified health status: Secondary | ICD-10-CM | POA: Diagnosis not present

## 2018-06-01 DIAGNOSIS — I10 Essential (primary) hypertension: Secondary | ICD-10-CM | POA: Diagnosis not present

## 2018-06-01 DIAGNOSIS — R1084 Generalized abdominal pain: Secondary | ICD-10-CM | POA: Diagnosis not present

## 2018-06-01 DIAGNOSIS — Z23 Encounter for immunization: Secondary | ICD-10-CM | POA: Insufficient documentation

## 2018-06-01 DIAGNOSIS — G8929 Other chronic pain: Secondary | ICD-10-CM | POA: Diagnosis not present

## 2018-06-28 ENCOUNTER — Inpatient Hospital Stay: Payer: Medicare HMO

## 2018-06-28 ENCOUNTER — Telehealth: Payer: Self-pay

## 2018-06-28 NOTE — Telephone Encounter (Signed)
Called patient to do her travel screening she stated that she had called last week to cancel and reschedule the appointment for tomorrow. Can you please call her and reschedule. Thank you.

## 2018-06-29 ENCOUNTER — Inpatient Hospital Stay: Payer: Medicare HMO | Admitting: Oncology

## 2018-06-29 ENCOUNTER — Inpatient Hospital Stay: Payer: Medicare HMO

## 2018-07-07 ENCOUNTER — Other Ambulatory Visit: Payer: Self-pay

## 2018-07-12 ENCOUNTER — Telehealth: Payer: Self-pay | Admitting: Gastroenterology

## 2018-07-12 NOTE — Telephone Encounter (Signed)
Alisa called with [facility]) requesting one time replacement Adalimumab (HUMIRA) 40 MG/0.8ML PSKTAdalimumab (HUMIRA) 40 MG/0.8ML PSKT.  Please phone to [phone] or fax to [phone].

## 2018-07-14 ENCOUNTER — Telehealth: Payer: Self-pay | Admitting: *Deleted

## 2018-07-14 NOTE — Telephone Encounter (Signed)
Patient called to CANCEL her 4/20 and 4/22 lab/MD/Infusion appts. due to [place].  She stated that she would call back at a later date to R/S.

## 2018-07-14 NOTE — Telephone Encounter (Signed)
Pharmacy has been contacted

## 2018-07-19 ENCOUNTER — Other Ambulatory Visit: Payer: Medicare HMO

## 2018-07-21 ENCOUNTER — Ambulatory Visit: Payer: Medicare HMO | Admitting: Oncology

## 2018-07-21 ENCOUNTER — Ambulatory Visit: Payer: Medicare HMO

## 2018-10-11 ENCOUNTER — Inpatient Hospital Stay: Payer: Medicare HMO | Attending: Oncology

## 2018-10-11 ENCOUNTER — Other Ambulatory Visit: Payer: Self-pay

## 2018-10-11 ENCOUNTER — Ambulatory Visit: Payer: Medicare HMO | Admitting: Oncology

## 2018-10-11 DIAGNOSIS — Z79899 Other long term (current) drug therapy: Secondary | ICD-10-CM | POA: Diagnosis not present

## 2018-10-11 DIAGNOSIS — Z841 Family history of disorders of kidney and ureter: Secondary | ICD-10-CM | POA: Diagnosis not present

## 2018-10-11 DIAGNOSIS — E213 Hyperparathyroidism, unspecified: Secondary | ICD-10-CM | POA: Insufficient documentation

## 2018-10-11 DIAGNOSIS — K51919 Ulcerative colitis, unspecified with unspecified complications: Secondary | ICD-10-CM | POA: Diagnosis not present

## 2018-10-11 DIAGNOSIS — Z803 Family history of malignant neoplasm of breast: Secondary | ICD-10-CM | POA: Insufficient documentation

## 2018-10-11 DIAGNOSIS — D500 Iron deficiency anemia secondary to blood loss (chronic): Secondary | ICD-10-CM | POA: Diagnosis present

## 2018-10-11 DIAGNOSIS — R5383 Other fatigue: Secondary | ICD-10-CM | POA: Insufficient documentation

## 2018-10-11 DIAGNOSIS — Z8249 Family history of ischemic heart disease and other diseases of the circulatory system: Secondary | ICD-10-CM | POA: Diagnosis not present

## 2018-10-11 LAB — FERRITIN: Ferritin: 30 ng/mL (ref 11–307)

## 2018-10-11 LAB — CBC WITH DIFFERENTIAL/PLATELET
Abs Immature Granulocytes: 0.02 10*3/uL (ref 0.00–0.07)
Basophils Absolute: 0 10*3/uL (ref 0.0–0.1)
Basophils Relative: 0 %
Eosinophils Absolute: 0.2 10*3/uL (ref 0.0–0.5)
Eosinophils Relative: 2 %
HCT: 42.9 % (ref 36.0–46.0)
Hemoglobin: 13.5 g/dL (ref 12.0–15.0)
Immature Granulocytes: 0 %
Lymphocytes Relative: 37 %
Lymphs Abs: 3 10*3/uL (ref 0.7–4.0)
MCH: 27.1 pg (ref 26.0–34.0)
MCHC: 31.5 g/dL (ref 30.0–36.0)
MCV: 86 fL (ref 80.0–100.0)
Monocytes Absolute: 0.4 10*3/uL (ref 0.1–1.0)
Monocytes Relative: 5 %
Neutro Abs: 4.5 10*3/uL (ref 1.7–7.7)
Neutrophils Relative %: 56 %
Platelets: 179 10*3/uL (ref 150–400)
RBC: 4.99 MIL/uL (ref 3.87–5.11)
RDW: 13.8 % (ref 11.5–15.5)
WBC: 8.1 10*3/uL (ref 4.0–10.5)
nRBC: 0 % (ref 0.0–0.2)

## 2018-10-11 LAB — IRON AND TIBC
Iron: 77 ug/dL (ref 28–170)
Saturation Ratios: 22 % (ref 10.4–31.8)
TIBC: 353 ug/dL (ref 250–450)
UIBC: 276 ug/dL

## 2018-10-12 ENCOUNTER — Other Ambulatory Visit: Payer: Self-pay

## 2018-10-12 ENCOUNTER — Inpatient Hospital Stay (HOSPITAL_BASED_OUTPATIENT_CLINIC_OR_DEPARTMENT_OTHER): Payer: Medicare HMO | Admitting: Oncology

## 2018-10-12 ENCOUNTER — Encounter: Payer: Self-pay | Admitting: Oncology

## 2018-10-12 VITALS — BP 123/86 | HR 106 | Temp 95.6°F | Wt 244.0 lb

## 2018-10-12 DIAGNOSIS — D500 Iron deficiency anemia secondary to blood loss (chronic): Secondary | ICD-10-CM | POA: Diagnosis not present

## 2018-10-12 DIAGNOSIS — K51919 Ulcerative colitis, unspecified with unspecified complications: Secondary | ICD-10-CM

## 2018-10-12 DIAGNOSIS — Z79899 Other long term (current) drug therapy: Secondary | ICD-10-CM

## 2018-10-12 DIAGNOSIS — R5383 Other fatigue: Secondary | ICD-10-CM | POA: Diagnosis not present

## 2018-10-12 DIAGNOSIS — E213 Hyperparathyroidism, unspecified: Secondary | ICD-10-CM | POA: Diagnosis not present

## 2018-10-12 DIAGNOSIS — Z841 Family history of disorders of kidney and ureter: Secondary | ICD-10-CM

## 2018-10-12 DIAGNOSIS — Z8249 Family history of ischemic heart disease and other diseases of the circulatory system: Secondary | ICD-10-CM

## 2018-10-12 DIAGNOSIS — Z803 Family history of malignant neoplasm of breast: Secondary | ICD-10-CM

## 2018-10-12 NOTE — Progress Notes (Signed)
Hematology/Oncology Consult note [facility] [phone] [phone]   Patient Care Team: System, Pcp Not In as PCP - General  REFERRING PROVIDER: Dr.Anna REASON FOR VISIT:  Follow up with  iron deficiency anemia  HISTORY OF PRESENTING ILLNESS:  Tiffany Rosales is a  63 y.o.  female with PMH listed below who was referred to me for evaluation of iron deficiency anemia Patient follows up with Dr. Vicente Males for ulcerative colitis.  On Humira every week. Recent lab work-up on 04/29/2018 showed ferritin 8, iron saturation 6, TIBC 410. CBC on 04/14/2018 showed hemoglobin 12.5, platelet count 206000, white count 8.  Normal differential. No previous iron panel to compare with.  Reviewed patient's previous labs, hemoglobin baseline 11.3-13.  Associated signs and symptoms: Patient reports chronic fatigue, denies SOB with exertion.  Denies weight loss, easy bruising. She has vision impairment so not able to report any history of hematochezia or hematuria.  Context:  History of iron deficiency: Denies Pica: Craves ice chips Last endoscopy: Ulcerative colitis.  Last flexible sigmoidoscopy on 10/29/2017 showed mild localized inflammation. Fatigue: Yes.  SOB: deneis Accompanied by sister to clinic today.  Sister mentioned she noticed a dark skin spot on patient's right ankle.  INTERVAL HISTORY Tiffany Rosales is a 63 y.o. female who has above history reviewed by me today presents for follow up visit for management of iron deficiency anemia.  Problems and complaints are listed below: She reports feeling well today. Reports good energy level.  Ulcerative Colitis, reports no new symptoms. On Humaria.   Review of Systems  Constitutional: Negative for appetite change, chills, fatigue and fever.  HENT:   Negative for hearing loss and voice change.        Vision impairment  Eyes: Negative for eye problems.  Respiratory: Negative for chest tightness and  cough.   Cardiovascular: Negative for chest pain.  Gastrointestinal: Negative for abdominal distention, abdominal pain and blood in stool.  Endocrine: Negative for hot flashes.  Genitourinary: Negative for difficulty urinating and frequency.   Musculoskeletal: Negative for arthralgias.  Skin: Negative for itching and rash.  Neurological: Negative for extremity weakness.  Hematological: Negative for adenopathy.  Psychiatric/Behavioral: Negative for confusion.    MEDICAL HISTORY:  Past Medical History:  Diagnosis Date  . Anemia   . Colitis   . Detached retina   . Hypertension   . Vision impairment     SURGICAL HISTORY: Past Surgical History:  Procedure Laterality Date  . ABDOMINAL HYSTERECTOMY    . COLONOSCOPY WITH PROPOFOL N/A 06/09/2017   Procedure: COLONOSCOPY WITH PROPOFOL;  Surgeon: Jonathon Bellows, MD;  Location: [facility] ENDOSCOPY;  Service: Gastroenterology;  Laterality: N/A;  . ESOPHAGOGASTRODUODENOSCOPY (EGD) WITH PROPOFOL N/A 05/11/2018   Procedure: ESOPHAGOGASTRODUODENOSCOPY (EGD) WITH PROPOFOL;  Surgeon: Jonathon Bellows, MD;  Location: [facility] ENDOSCOPY;  Service: Gastroenterology;  Laterality: N/A;  . EYE SURGERY     1982  . FLEXIBLE SIGMOIDOSCOPY N/A 10/29/2017   Procedure: FLEXIBLE SIGMOIDOSCOPY;  Surgeon: Jonathon Bellows, MD;  Location: [facility] ENDOSCOPY;  Service: Gastroenterology;  Laterality: N/A;    SOCIAL HISTORY: Social History   Socioeconomic History  . Marital status: Single    Spouse name: Not on file  . Number of children: Not on file  . Years of education: Not on file  . Highest education level: Not on file  Occupational History  . Not on file  Social Needs  . Financial resource strain: Not on file  . Food insecurity    Worry: Not on  file    Inability: Not on file  . Transportation needs    Medical: Not on file    Non-medical: Not on file  Tobacco Use  . Smoking status: Never Smoker  . Smokeless tobacco: Never Used  Substance and Sexual Activity  . Alcohol  use: No  . Drug use: No  . Sexual activity: Never  Lifestyle  . Physical activity    Days per week: Not on file    Minutes per session: Not on file  . Stress: Not on file  Relationships  . Social Herbalist on phone: Not on file    Gets together: Not on file    Attends religious service: Not on file    Active member of club or organization: Not on file    Attends meetings of clubs or organizations: Not on file    Relationship status: Not on file  . Intimate partner violence    Fear of current or ex partner: Not on file    Emotionally abused: Not on file    Physically abused: Not on file    Forced sexual activity: Not on file  Other Topics Concern  . Not on file  Social History Narrative  . Not on file    FAMILY HISTORY: Family History  Problem Relation Age of Onset  . Breast cancer Mother   . Kidney disease Mother   . Hypertension Sister   . Hypertension Brother   . Breast cancer Cousin   . Breast cancer Cousin     ALLERGIES:  is allergic to other.  MEDICATIONS:  Current Outpatient Medications  Medication Sig Dispense Refill  . Acetaminophen 500 MG coapsule     . Adalimumab (HUMIRA) 40 MG/0.8ML PSKT Inject 40 mg into the skin.    Marland Kitchen gabapentin (NEURONTIN) 100 MG capsule 1 tab PO QHS inc to 3 tabs PO QHS over the next 10 days    . lisinopril (PRINIVIL,ZESTRIL) 10 MG tablet     . Multiple Vitamins-Minerals (MULTIVITAMIN WITH MINERALS) tablet Take 1 tablet by mouth daily.    Marland Kitchen oxyCODONE-acetaminophen (PERCOCET) 7.5-325 MG tablet     . omeprazole (PRILOSEC) 20 MG capsule Take 1 capsule (20 mg total) by mouth 2 (two) times daily for 14 days. 28 capsule 0   No current facility-administered medications for this visit.      PHYSICAL EXAMINATION: ECOG PERFORMANCE STATUS: 1 - Symptomatic but completely ambulatory Vitals:   10/12/18 1010  BP: 123/86  Pulse: (!) 106  Temp: (!) 95.6 F (35.3 C)   Filed Weights   10/12/18 1010  Weight: 244 lb (110.7 kg)     Physical Exam Constitutional:      General: She is not in acute distress. HENT:     Head: Normocephalic and atraumatic.  Eyes:     General: No scleral icterus.    Pupils: Pupils are equal, round, and reactive to light.     Comments: Chronic vision impairment  Neck:     Musculoskeletal: Normal range of motion and neck supple.  Cardiovascular:     Rate and Rhythm: Normal rate and regular rhythm.     Heart sounds: Normal heart sounds.  Pulmonary:     Effort: Pulmonary effort is normal. No respiratory distress.     Breath sounds: No wheezing.  Abdominal:     General: Bowel sounds are normal. There is no distension.     Palpations: Abdomen is soft. There is no mass.     Tenderness: There  is no abdominal tenderness.  Musculoskeletal: Normal range of motion.        General: No deformity.  Skin:    General: Skin is warm and dry.     Findings: No erythema or rash.     Comments: Right ankle skin dark discolorations lesion  Neurological:     Mental Status: She is alert and oriented to person, place, and time.     Cranial Nerves: No cranial nerve deficit.     Coordination: Coordination normal.  Psychiatric:        Mood and Affect: Mood normal.        Behavior: Behavior normal.        Thought Content: Thought content normal.     LABORATORY DATA:  I have reviewed the data as listed Lab Results  Component Value Date   WBC 8.1 10/11/2018   HGB 13.5 10/11/2018   HCT 42.9 10/11/2018   MCV 86.0 10/11/2018   PLT 179 10/11/2018   Recent Labs    10/22/17 1111 04/14/18 1103 04/29/18 1419  NA 141 144  --   K 3.9 4.6  --   CL 113* 111*  --   CO2 22 20  --   GLUCOSE 98 86  --   BUN 12 12  --   CREATININE 0.82 0.90  --   CALCIUM 10.3 10.6* 10.8*  GFRNONAA >60 69  --   GFRAA >60 80  --   PROT 7.7 7.5  --   ALBUMIN 3.8 4.0  --   AST 26 14  --   ALT 24 13  --   ALKPHOS 109 127*  --   BILITOT 1.1 0.4  --    Iron/TIBC/Ferritin/ %Sat    Component Value Date/Time   IRON 77  10/11/2018 1337   IRON 26 (L) 04/29/2018 1417   TIBC 353 10/11/2018 1337   TIBC 410 04/29/2018 1417   FERRITIN 30 10/11/2018 1337   FERRITIN 8 (L) 04/29/2018 1417   IRONPCTSAT 22 10/11/2018 1337   IRONPCTSAT 6 (LL) 04/29/2018 1417      ASSESSMENT & PLAN:  1. Iron deficiency anemia due to chronic blood loss   2. Ulcerative colitis with complication, unspecified location ([place])   3. Hyperparathyroidism ([place])   #Iron deficiency due to ulcerative colitis CBC showed stable hemoglobin. Iron panel showed stable iron store.  Hold additional IV iron treatment.   Discussed with patient that due to UC, she is at risk of developing iron deficiency again. Recommend monitoring blood counts and iron panel 2-3 times annually.   #Ulcerative colitis, continue  follow-up with Dr. Vicente Males.  Currently on Humira #Elevated PTH and calcium level, likely primary hyperparathyroidism.  Patient previously informed me that she will see [facility] specialist for further evaluation. She has not established care yet. Recommend patient to discuss with PCP and have calcium level rechecked. She says she has appt with PCP in September. I recommend patient to call now instead of waiting another two months to discuss with PCP.   Cc PCP Dr.Kane   Orders Placed This Encounter  Procedures  . CBC with Differential/Platelet    Standing Status:   Future    Standing Expiration Date:   10/12/2019  . Iron and TIBC    Standing Status:   Future    Standing Expiration Date:   10/12/2019  . Ferritin    Standing Status:   Future    Standing Expiration Date:   10/12/2019    All questions were answered. The  patient knows to call the clinic with any problems questions or concerns.   Earlie Server, MD, PhD Hematology Oncology [facility] at [facility] Pager- [phone] 10/12/2018

## 2018-10-12 NOTE — Progress Notes (Signed)
Patient here today for follow up.  Patient states no new concerns today  

## 2018-12-23 ENCOUNTER — Other Ambulatory Visit: Payer: Self-pay | Admitting: *Deleted

## 2018-12-23 DIAGNOSIS — Z20822 Contact with and (suspected) exposure to covid-19: Secondary | ICD-10-CM

## 2018-12-24 LAB — NOVEL CORONAVIRUS, NAA: SARS-CoV-2, NAA: NOT DETECTED

## 2019-01-18 ENCOUNTER — Ambulatory Visit: Payer: Medicare HMO | Admitting: Gastroenterology

## 2019-01-18 ENCOUNTER — Other Ambulatory Visit: Payer: Self-pay

## 2019-01-18 ENCOUNTER — Telehealth: Payer: Self-pay

## 2019-01-18 DIAGNOSIS — R799 Abnormal finding of blood chemistry, unspecified: Secondary | ICD-10-CM

## 2019-01-18 DIAGNOSIS — K51011 Ulcerative (chronic) pancolitis with rectal bleeding: Secondary | ICD-10-CM

## 2019-01-18 NOTE — Telephone Encounter (Signed)
Spoke with pt and informed her that Dr. Vicente Males has ordered lab test to be completed before he sees her for a follow up. I also reminded her that she still needs the bone density scan completed as well. Pt agrees to complete the labs but wants to hold off on the bone density scan due to the COVID pandemic. Dr. Vicente Males agrees.

## 2019-02-09 ENCOUNTER — Other Ambulatory Visit: Payer: Medicare HMO

## 2019-02-09 ENCOUNTER — Other Ambulatory Visit: Payer: Self-pay

## 2019-02-10 ENCOUNTER — Inpatient Hospital Stay: Payer: Medicare HMO | Attending: Oncology

## 2019-02-10 ENCOUNTER — Other Ambulatory Visit: Payer: Self-pay

## 2019-02-10 DIAGNOSIS — Z79899 Other long term (current) drug therapy: Secondary | ICD-10-CM | POA: Insufficient documentation

## 2019-02-10 DIAGNOSIS — E213 Hyperparathyroidism, unspecified: Secondary | ICD-10-CM | POA: Diagnosis not present

## 2019-02-10 DIAGNOSIS — I10 Essential (primary) hypertension: Secondary | ICD-10-CM | POA: Insufficient documentation

## 2019-02-10 DIAGNOSIS — D509 Iron deficiency anemia, unspecified: Secondary | ICD-10-CM | POA: Diagnosis not present

## 2019-02-10 DIAGNOSIS — K5190 Ulcerative colitis, unspecified, without complications: Secondary | ICD-10-CM | POA: Insufficient documentation

## 2019-02-10 DIAGNOSIS — R5383 Other fatigue: Secondary | ICD-10-CM | POA: Insufficient documentation

## 2019-02-10 DIAGNOSIS — Z8249 Family history of ischemic heart disease and other diseases of the circulatory system: Secondary | ICD-10-CM | POA: Diagnosis not present

## 2019-02-10 DIAGNOSIS — Z803 Family history of malignant neoplasm of breast: Secondary | ICD-10-CM | POA: Diagnosis not present

## 2019-02-10 DIAGNOSIS — D500 Iron deficiency anemia secondary to blood loss (chronic): Secondary | ICD-10-CM

## 2019-02-10 LAB — CBC WITH DIFFERENTIAL/PLATELET
Abs Immature Granulocytes: 0.04 10*3/uL (ref 0.00–0.07)
Basophils Absolute: 0 10*3/uL (ref 0.0–0.1)
Basophils Relative: 0 %
Eosinophils Absolute: 0.3 10*3/uL (ref 0.0–0.5)
Eosinophils Relative: 3 %
HCT: 43.5 % (ref 36.0–46.0)
Hemoglobin: 13.4 g/dL (ref 12.0–15.0)
Immature Granulocytes: 0 %
Lymphocytes Relative: 35 %
Lymphs Abs: 3.4 10*3/uL (ref 0.7–4.0)
MCH: 26.5 pg (ref 26.0–34.0)
MCHC: 30.8 g/dL (ref 30.0–36.0)
MCV: 86.1 fL (ref 80.0–100.0)
Monocytes Absolute: 0.5 10*3/uL (ref 0.1–1.0)
Monocytes Relative: 5 %
Neutro Abs: 5.4 10*3/uL (ref 1.7–7.7)
Neutrophils Relative %: 57 %
Platelets: 181 10*3/uL (ref 150–400)
RBC: 5.05 MIL/uL (ref 3.87–5.11)
RDW: 13.6 % (ref 11.5–15.5)
WBC: 9.7 10*3/uL (ref 4.0–10.5)
nRBC: 0 % (ref 0.0–0.2)

## 2019-02-10 LAB — IRON AND TIBC
Iron: 63 ug/dL (ref 28–170)
Saturation Ratios: 18 % (ref 10.4–31.8)
TIBC: 358 ug/dL (ref 250–450)
UIBC: 295 ug/dL

## 2019-02-10 LAB — FERRITIN: Ferritin: 26 ng/mL (ref 11–307)

## 2019-02-11 ENCOUNTER — Inpatient Hospital Stay (HOSPITAL_BASED_OUTPATIENT_CLINIC_OR_DEPARTMENT_OTHER): Payer: Medicare HMO | Admitting: Oncology

## 2019-02-11 ENCOUNTER — Other Ambulatory Visit: Payer: Self-pay

## 2019-02-11 ENCOUNTER — Encounter: Payer: Self-pay | Admitting: Oncology

## 2019-02-11 ENCOUNTER — Inpatient Hospital Stay: Payer: Medicare HMO

## 2019-02-11 VITALS — BP 133/88 | HR 92 | Temp 96.9°F | Resp 18

## 2019-02-11 DIAGNOSIS — E213 Hyperparathyroidism, unspecified: Secondary | ICD-10-CM

## 2019-02-11 DIAGNOSIS — K51919 Ulcerative colitis, unspecified with unspecified complications: Secondary | ICD-10-CM

## 2019-02-11 DIAGNOSIS — D500 Iron deficiency anemia secondary to blood loss (chronic): Secondary | ICD-10-CM | POA: Diagnosis not present

## 2019-02-11 DIAGNOSIS — D509 Iron deficiency anemia, unspecified: Secondary | ICD-10-CM | POA: Diagnosis not present

## 2019-02-11 NOTE — Progress Notes (Signed)
Patient does not offer any problems today.  

## 2019-02-12 NOTE — Progress Notes (Signed)
Hematology/Oncology Consult note [facility] [phone] [phone]   Patient Care Team: System, Pcp Not In as PCP - General  REFERRING PROVIDER: Dr.Anna REASON FOR VISIT:  Follow up with  iron deficiency anemia  HISTORY OF PRESENTING ILLNESS:  Tiffany Rosales is a  63 y.o.  female with PMH listed below who was referred to me for evaluation of iron deficiency anemia Patient follows up with Dr. Vicente Males for ulcerative colitis.  On Humira every week. Recent lab work-up on 04/29/2018 showed ferritin 8, iron saturation 6, TIBC 410. CBC on 04/14/2018 showed hemoglobin 12.5, platelet count 206000, white count 8.  Normal differential. No previous iron panel to compare with.  Reviewed patient's previous labs, hemoglobin baseline 11.3-13.  Associated signs and symptoms: Patient reports chronic fatigue, denies SOB with exertion.  Denies weight loss, easy bruising. She has vision impairment so not able to report any history of hematochezia or hematuria.  Context:  History of iron deficiency: Denies Pica: Craves ice chips Last endoscopy: Ulcerative colitis.  Last flexible sigmoidoscopy on 10/29/2017 showed mild localized inflammation. Fatigue: Yes.  SOB: deneis Accompanied by sister to clinic today.  Sister mentioned she noticed a dark skin spot on patient's right ankle.  INTERVAL HISTORY Tiffany Rosales is a 63 y.o. female who has above history reviewed by me today presents for follow up visit for management of iron deficiency anemia.  Problems and complaints are listed below: She reports feeling well today. Good energy level.  Ulcerative colitis, symptoms are controlled well on Humaria.     Review of Systems  Constitutional: Negative for appetite change, chills, fatigue and fever.  HENT:   Negative for hearing loss and voice change.        Vision impairment  Eyes: Negative for eye problems.  Respiratory: Negative for chest tightness and  cough.   Cardiovascular: Negative for chest pain.  Gastrointestinal: Negative for abdominal distention, abdominal pain and blood in stool.  Endocrine: Negative for hot flashes.  Genitourinary: Negative for difficulty urinating and frequency.   Musculoskeletal: Negative for arthralgias.  Skin: Negative for itching and rash.  Neurological: Negative for extremity weakness.  Hematological: Negative for adenopathy.  Psychiatric/Behavioral: Negative for confusion.    MEDICAL HISTORY:  Past Medical History:  Diagnosis Date  . Anemia   . Colitis   . Detached retina   . Hypertension   . Vision impairment     SURGICAL HISTORY: Past Surgical History:  Procedure Laterality Date  . ABDOMINAL HYSTERECTOMY    . COLONOSCOPY WITH PROPOFOL N/A 06/09/2017   Procedure: COLONOSCOPY WITH PROPOFOL;  Surgeon: Jonathon Bellows, MD;  Location: [facility] ENDOSCOPY;  Service: Gastroenterology;  Laterality: N/A;  . ESOPHAGOGASTRODUODENOSCOPY (EGD) WITH PROPOFOL N/A 05/11/2018   Procedure: ESOPHAGOGASTRODUODENOSCOPY (EGD) WITH PROPOFOL;  Surgeon: Jonathon Bellows, MD;  Location: [facility] ENDOSCOPY;  Service: Gastroenterology;  Laterality: N/A;  . EYE SURGERY     1982  . FLEXIBLE SIGMOIDOSCOPY N/A 10/29/2017   Procedure: FLEXIBLE SIGMOIDOSCOPY;  Surgeon: Jonathon Bellows, MD;  Location: [facility] ENDOSCOPY;  Service: Gastroenterology;  Laterality: N/A;    SOCIAL HISTORY: Social History   Socioeconomic History  . Marital status: Single    Spouse name: Not on file  . Number of children: Not on file  . Years of education: Not on file  . Highest education level: Not on file  Occupational History  . Not on file  Social Needs  . Financial resource strain: Not on file  . Food insecurity    Worry: Not  on file    Inability: Not on file  . Transportation needs    Medical: Not on file    Non-medical: Not on file  Tobacco Use  . Smoking status: Never Smoker  . Smokeless tobacco: Never Used  Substance and Sexual Activity  . Alcohol  use: No  . Drug use: No  . Sexual activity: Never  Lifestyle  . Physical activity    Days per week: Not on file    Minutes per session: Not on file  . Stress: Not on file  Relationships  . Social Herbalist on phone: Not on file    Gets together: Not on file    Attends religious service: Not on file    Active member of club or organization: Not on file    Attends meetings of clubs or organizations: Not on file    Relationship status: Not on file  . Intimate partner violence    Fear of current or ex partner: Not on file    Emotionally abused: Not on file    Physically abused: Not on file    Forced sexual activity: Not on file  Other Topics Concern  . Not on file  Social History Narrative  . Not on file    FAMILY HISTORY: Family History  Problem Relation Age of Onset  . Breast cancer Mother   . Kidney disease Mother   . Hypertension Sister   . Hypertension Brother   . Breast cancer Cousin   . Breast cancer Cousin     ALLERGIES:  is allergic to other.  MEDICATIONS:  Current Outpatient Medications  Medication Sig Dispense Refill  . Acetaminophen 500 MG coapsule     . Adalimumab (HUMIRA) 40 MG/0.8ML PSKT Inject 40 mg into the skin.    Marland Kitchen lisinopril (PRINIVIL,ZESTRIL) 10 MG tablet     . oxyCODONE-acetaminophen (PERCOCET) 7.5-325 MG tablet     . Multiple Vitamins-Minerals (MULTIVITAMIN WITH MINERALS) tablet Take 1 tablet by mouth daily.     No current facility-administered medications for this visit.      PHYSICAL EXAMINATION: ECOG PERFORMANCE STATUS: 1 - Symptomatic but completely ambulatory Vitals:   02/11/19 1306  BP: 133/88  Pulse: 92  Resp: 18  Temp: (!) 96.9 F (36.1 C)   There were no vitals filed for this visit.  Physical Exam Constitutional:      General: She is not in acute distress.    Comments: Sitting in wheelchair.   HENT:     Head: Normocephalic and atraumatic.  Eyes:     General: No scleral icterus.    Pupils: Pupils are  equal, round, and reactive to light.     Comments: Chronic vision impairment  Neck:     Musculoskeletal: Normal range of motion and neck supple.  Cardiovascular:     Rate and Rhythm: Normal rate and regular rhythm.     Heart sounds: Normal heart sounds.  Pulmonary:     Effort: Pulmonary effort is normal. No respiratory distress.     Breath sounds: No wheezing.  Abdominal:     General: Bowel sounds are normal. There is no distension.     Palpations: Abdomen is soft. There is no mass.     Tenderness: There is no abdominal tenderness.  Musculoskeletal: Normal range of motion.        General: No deformity.  Skin:    General: Skin is warm and dry.     Findings: No erythema or rash.  Neurological:  Mental Status: She is alert and oriented to person, place, and time.     Cranial Nerves: No cranial nerve deficit.     Coordination: Coordination normal.  Psychiatric:        Mood and Affect: Mood normal.        Behavior: Behavior normal.        Thought Content: Thought content normal.     LABORATORY DATA:  I have reviewed the data as listed Lab Results  Component Value Date   WBC 9.7 02/10/2019   HGB 13.4 02/10/2019   HCT 43.5 02/10/2019   MCV 86.1 02/10/2019   PLT 181 02/10/2019   Recent Labs    04/14/18 1103 04/29/18 1419  NA 144  --   K 4.6  --   CL 111*  --   CO2 20  --   GLUCOSE 86  --   BUN 12  --   CREATININE 0.90  --   CALCIUM 10.6* 10.8*  GFRNONAA 69  --   GFRAA 80  --   PROT 7.5  --   ALBUMIN 4.0  --   AST 14  --   ALT 13  --   ALKPHOS 127*  --   BILITOT 0.4  --    Iron/TIBC/Ferritin/ %Sat    Component Value Date/Time   IRON 63 02/10/2019 1435   IRON 26 (L) 04/29/2018 1417   TIBC 358 02/10/2019 1435   TIBC 410 04/29/2018 1417   FERRITIN 26 02/10/2019 1435   FERRITIN 8 (L) 04/29/2018 1417   IRONPCTSAT 18 02/10/2019 1435   IRONPCTSAT 6 (LL) 04/29/2018 1417     RADIOGRAPHIC STUDIES: I have personally reviewed the radiological images as listed  and agreed with the findings in the report. No results found.  ASSESSMENT & PLAN:  1. Iron deficiency anemia due to chronic blood loss   2. Ulcerative colitis with complication, unspecified location ([place])   3. Hyperparathyroidism ([place])   #Iron deficiency due to ulcerative colitis Labs are reviewed and discussed with the patient. CBC showed a stable and normal hemoglobin at 13.4. Iron panel showed ferritin is 26, iron saturation 18, stable. No need for iron infusion at this time. She is at risk of developing iron deficiency. Recommend monitor counts and iron panel every 6 months.  Ulcerative colitis, continue follow-up with gastroenterology.  On Humira. Elevated PTH and calcium level.  Likely primary hyperparathyroidism.  Patient was previously referred by primary care provider to [facility] specialist for further evaluation.  She has not established care yet.  I encourage patient to further discuss with primary care provider.   Cc PCP Dr.Kane   Orders Placed This Encounter  Procedures  . CBC with Differential    Standing Status:   Future    Standing Expiration Date:   02/11/2020  . Ferritin    Standing Status:   Future    Standing Expiration Date:   02/11/2020  . Iron and TIBC    Standing Status:   Future    Standing Expiration Date:   02/11/2020    All questions were answered. The patient knows to call the clinic with any problems questions or concerns.   Earlie Server, MD, PhD Hematology Oncology [facility] at [facility] Pager- [phone] 02/12/2019

## 2019-02-18 ENCOUNTER — Other Ambulatory Visit: Payer: Self-pay

## 2019-02-18 MED ORDER — HUMIRA (2 PEN) 40 MG/0.4ML ~~LOC~~ AJKT: 40.0000 mg | AUTO-INJECTOR | SUBCUTANEOUS | 3 refills | Status: AC

## 2019-03-22 ENCOUNTER — Ambulatory Visit
Admission: RE | Admit: 2019-03-22 | Discharge: 2019-03-22 | Disposition: A | Discharge status: Home / Self Care | Payer: Medicare HMO | Source: Ambulatory Visit | Attending: Gastroenterology | Admitting: Gastroenterology

## 2019-03-22 DIAGNOSIS — Z13820 Encounter for screening for osteoporosis: Secondary | ICD-10-CM | POA: Diagnosis not present

## 2019-03-22 DIAGNOSIS — K5100 Ulcerative (chronic) pancolitis without complications: Secondary | ICD-10-CM | POA: Insufficient documentation

## 2019-03-22 DIAGNOSIS — M8589 Other specified disorders of bone density and structure, multiple sites: Secondary | ICD-10-CM | POA: Insufficient documentation

## 2019-03-29 ENCOUNTER — Telehealth: Payer: Self-pay

## 2019-03-29 NOTE — Telephone Encounter (Signed)
Pt left a vm requesting DEXA scan results. Spoke with pt and informed her that Dr. Vicente Males has not review the results yet. I explained that the once he reviews and gives his recommendations we will contact her to inform. Pt understands that Dr. Vicente Males is out of the office until 04-04-19.

## 2019-03-30 ENCOUNTER — Encounter: Payer: Self-pay | Admitting: Gastroenterology

## 2019-03-30 NOTE — Telephone Encounter (Signed)
Inform has osteopenia and no osteoporosis- suggest daily calcium and vitamin D supplement over the counter

## 2019-04-04 NOTE — Telephone Encounter (Signed)
Spoke with pt and informed her of DEXA scan results and Dr. Georgeann Oppenheim recommendation. Pt understands and agrees.

## 2019-04-22 ENCOUNTER — Telehealth: Payer: Self-pay

## 2019-04-22 NOTE — Telephone Encounter (Signed)
Pt has been informed.

## 2019-04-22 NOTE — Telephone Encounter (Signed)
Yes its safe.

## 2019-04-22 NOTE — Telephone Encounter (Signed)
Pt called to request advice from Dr. Vicente Males. Pt wants to know if she's safe to get the COVID vaccine while taking Humira? I explained that I will relay her question to Dr. Vicente Males for advice.

## 2019-08-10 ENCOUNTER — Other Ambulatory Visit: Payer: Self-pay

## 2019-08-10 ENCOUNTER — Inpatient Hospital Stay: Payer: Medicare HMO | Attending: Oncology

## 2019-08-10 DIAGNOSIS — D500 Iron deficiency anemia secondary to blood loss (chronic): Secondary | ICD-10-CM

## 2019-08-10 DIAGNOSIS — K5190 Ulcerative colitis, unspecified, without complications: Secondary | ICD-10-CM | POA: Diagnosis not present

## 2019-08-10 DIAGNOSIS — Z803 Family history of malignant neoplasm of breast: Secondary | ICD-10-CM | POA: Insufficient documentation

## 2019-08-10 LAB — CBC WITH DIFFERENTIAL/PLATELET
Abs Immature Granulocytes: 0.03 10*3/uL (ref 0.00–0.07)
Basophils Absolute: 0 10*3/uL (ref 0.0–0.1)
Basophils Relative: 1 %
Eosinophils Absolute: 0.2 10*3/uL (ref 0.0–0.5)
Eosinophils Relative: 2 %
HCT: 41.9 % (ref 36.0–46.0)
Hemoglobin: 13.4 g/dL (ref 12.0–15.0)
Immature Granulocytes: 0 %
Lymphocytes Relative: 34 %
Lymphs Abs: 2.9 10*3/uL (ref 0.7–4.0)
MCH: 26.8 pg (ref 26.0–34.0)
MCHC: 32 g/dL (ref 30.0–36.0)
MCV: 83.8 fL (ref 80.0–100.0)
Monocytes Absolute: 0.5 10*3/uL (ref 0.1–1.0)
Monocytes Relative: 6 %
Neutro Abs: 4.8 10*3/uL (ref 1.7–7.7)
Neutrophils Relative %: 57 %
Platelets: 185 10*3/uL (ref 150–400)
RBC: 5 MIL/uL (ref 3.87–5.11)
RDW: 13.7 % (ref 11.5–15.5)
WBC: 8.4 10*3/uL (ref 4.0–10.5)
nRBC: 0 % (ref 0.0–0.2)

## 2019-08-10 LAB — IRON AND TIBC
Iron: 59 ug/dL (ref 28–170)
Saturation Ratios: 15 % (ref 10.4–31.8)
TIBC: 392 ug/dL (ref 250–450)
UIBC: 333 ug/dL

## 2019-08-10 LAB — FERRITIN: Ferritin: 13 ng/mL (ref 11–307)

## 2019-08-11 ENCOUNTER — Telehealth: Payer: Self-pay | Admitting: *Deleted

## 2019-08-11 NOTE — Telephone Encounter (Signed)
Patient called asking if she needed to come to apt tomorrow. She does not want to come in just to be told that she does not need iron infusion. Please call her back and let her know. [phone]

## 2019-08-11 NOTE — Telephone Encounter (Signed)
Dr. Tasia Catchings still likes to evaluate the patient whether IV iron needed or not.  Will get her opinion on need of appt for tomorrow.

## 2019-08-11 NOTE — Telephone Encounter (Signed)
Patient notified that Dr. Tasia Catchings plans on offering her IV iron tomorrow.

## 2019-08-12 ENCOUNTER — Inpatient Hospital Stay: Payer: Medicare HMO

## 2019-08-12 ENCOUNTER — Inpatient Hospital Stay (HOSPITAL_BASED_OUTPATIENT_CLINIC_OR_DEPARTMENT_OTHER): Payer: Medicare HMO | Admitting: Oncology

## 2019-08-12 ENCOUNTER — Other Ambulatory Visit: Payer: Self-pay

## 2019-08-12 ENCOUNTER — Encounter: Payer: Self-pay | Admitting: Oncology

## 2019-08-12 VITALS — BP 115/88 | HR 98 | Resp 18

## 2019-08-12 VITALS — BP 118/96 | HR 103 | Temp 95.7°F | Resp 18 | Wt 249.9 lb

## 2019-08-12 DIAGNOSIS — D500 Iron deficiency anemia secondary to blood loss (chronic): Secondary | ICD-10-CM

## 2019-08-12 MED ORDER — IRON SUCROSE 20 MG/ML IV SOLN
200.0000 mg | Freq: Once | INTRAVENOUS | Status: AC
  Administered 2019-08-12: 200 mg via INTRAVENOUS
  Filled 2019-08-12: qty 10

## 2019-08-12 MED ORDER — SODIUM CHLORIDE 0.9 % IV SOLN
Freq: Once | INTRAVENOUS | Status: AC
  Filled 2019-08-12: qty 250

## 2019-08-12 NOTE — Progress Notes (Signed)
Pt here for follow up. No new concerns voiced.   

## 2019-08-13 NOTE — Progress Notes (Signed)
Hematology/Oncology Consult note [facility] Telephone:([phone] [phone]   Patient Care Team: System, Pcp Not In as PCP - General  REFERRING PROVIDER: Dr.Anna REASON FOR VISIT:  Follow up with  iron deficiency anemia  HISTORY OF PRESENTING ILLNESS:  Tiffany Rosales is a  63 y.o.  female with PMH listed below who was referred to me for evaluation of iron deficiency anemia Patient follows up with Dr. Vicente Males for ulcerative colitis.  On Humira every week. Recent lab work-up on 04/29/2018 showed ferritin 8, iron saturation 6, TIBC 410. CBC on 04/14/2018 showed hemoglobin 12.5, platelet count 206000, white count 8.  Normal differential. No previous iron panel to compare with.  Reviewed patient's previous labs, hemoglobin baseline 11.3-13.  Associated signs and symptoms: Patient reports chronic fatigue, denies SOB with exertion.  Denies weight loss, easy bruising. She has vision impairment so not able to report any history of hematochezia or hematuria.  Context:  History of iron deficiency: Denies Pica: Craves ice chips Last endoscopy: Ulcerative colitis.  Last flexible sigmoidoscopy on 10/29/2017 showed mild localized inflammation. Fatigue: Yes.  SOB: deneis Accompanied by sister to clinic today.  Sister mentioned she noticed a dark skin spot on patient's right ankle.  INTERVAL HISTORY Tiffany Rosales is a 64 y.o. female who has above history reviewed by me today presents for follow up visit for management of iron deficiency anemia.  Problems and complaints are listed below: She reports feeling tried. On Humaria for ulcerative colitis. Symptoms are controlled.    Review of Systems  Constitutional: Positive for fatigue. Negative for appetite change, chills and fever.  HENT:   Negative for hearing loss and voice change.        Vision impairment  Eyes: Negative for eye problems.  Respiratory: Negative for chest tightness and cough.     Cardiovascular: Negative for chest pain.  Gastrointestinal: Negative for abdominal distention, abdominal pain and blood in stool.  Endocrine: Negative for hot flashes.  Genitourinary: Negative for difficulty urinating and frequency.   Musculoskeletal: Negative for arthralgias.  Skin: Negative for itching and rash.  Neurological: Negative for extremity weakness.  Hematological: Negative for adenopathy.  Psychiatric/Behavioral: Negative for confusion.    MEDICAL HISTORY:  Past Medical History:  Diagnosis Date  . Anemia   . Colitis   . Detached retina   . Hypertension   . Vision impairment     SURGICAL HISTORY: Past Surgical History:  Procedure Laterality Date  . ABDOMINAL HYSTERECTOMY    . COLONOSCOPY WITH PROPOFOL N/A 06/09/2017   Procedure: COLONOSCOPY WITH PROPOFOL;  Surgeon: Jonathon Bellows, MD;  Location: [facility] ENDOSCOPY;  Service: Gastroenterology;  Laterality: N/A;  . ESOPHAGOGASTRODUODENOSCOPY (EGD) WITH PROPOFOL N/A 05/11/2018   Procedure: ESOPHAGOGASTRODUODENOSCOPY (EGD) WITH PROPOFOL;  Surgeon: Jonathon Bellows, MD;  Location: [facility] ENDOSCOPY;  Service: Gastroenterology;  Laterality: N/A;  . EYE SURGERY     1982  . FLEXIBLE SIGMOIDOSCOPY N/A 10/29/2017   Procedure: FLEXIBLE SIGMOIDOSCOPY;  Surgeon: Jonathon Bellows, MD;  Location: [facility] ENDOSCOPY;  Service: Gastroenterology;  Laterality: N/A;    SOCIAL HISTORY: Social History   Socioeconomic History  . Marital status: Single    Spouse name: Not on file  . Number of children: Not on file  . Years of education: Not on file  . Highest education level: Not on file  Occupational History  . Not on file  Tobacco Use  . Smoking status: Never Smoker  . Smokeless tobacco: Never Used  Substance and Sexual Activity  . Alcohol  use: No  . Drug use: No  . Sexual activity: Never  Other Topics Concern  . Not on file  Social History Narrative  . Not on file   Social Determinants of Health   Financial Resource Strain:   . Difficulty  of Paying Living Expenses:   Food Insecurity:   . Worried About Charity fundraiser in the Last Year:   . Arboriculturist in the Last Year:   Transportation Needs:   . Film/video editor (Medical):   Marland Kitchen Lack of Transportation (Non-Medical):   Physical Activity:   . Days of Exercise per Week:   . Minutes of Exercise per Session:   Stress:   . Feeling of Stress :   Social Connections:   . Frequency of Communication with Friends and Family:   . Frequency of Social Gatherings with Friends and Family:   . Attends Religious Services:   . Active Member of Clubs or Organizations:   . Attends Archivist Meetings:   Marland Kitchen Marital Status:   Intimate Partner Violence:   . Fear of Current or Ex-Partner:   . Emotionally Abused:   Marland Kitchen Physically Abused:   . Sexually Abused:     FAMILY HISTORY: Family History  Problem Relation Age of Onset  . Breast cancer Mother   . Kidney disease Mother   . Hypertension Sister   . Hypertension Brother   . Breast cancer Cousin   . Breast cancer Cousin     ALLERGIES:  is allergic to other.  MEDICATIONS:  Current Outpatient Medications  Medication Sig Dispense Refill  . Acetaminophen 500 MG coapsule     . Adalimumab (HUMIRA PEN) 40 MG/0.4ML PNKT Inject 40 mg into the skin once a week. 12 each 3  . lisinopril (PRINIVIL,ZESTRIL) 10 MG tablet     . Multiple Vitamins-Minerals (MULTIVITAMIN WITH MINERALS) tablet Take 1 tablet by mouth daily.    Marland Kitchen oxyCODONE-acetaminophen (PERCOCET) 7.5-325 MG tablet every 6 (six) hours as needed.      No current facility-administered medications for this visit.     PHYSICAL EXAMINATION: ECOG PERFORMANCE STATUS: 1 - Symptomatic but completely ambulatory Vitals:   08/12/19 1308  BP: (!) 118/96  Pulse: (!) 103  Resp: 18  Temp: (!) 95.7 F (35.4 C)  SpO2: 97%   Filed Weights   08/12/19 1308  Weight: 249 lb 14.4 oz (113.4 kg)    Physical Exam Constitutional:      General: She is not in acute  distress.    Comments: Sitting in wheelchair.   HENT:     Head: Normocephalic and atraumatic.  Eyes:     General: No scleral icterus.    Pupils: Pupils are equal, round, and reactive to light.     Comments: Chronic vision impairment  Cardiovascular:     Rate and Rhythm: Normal rate and regular rhythm.     Heart sounds: Normal heart sounds.  Pulmonary:     Effort: Pulmonary effort is normal. No respiratory distress.     Breath sounds: No wheezing.  Abdominal:     General: Bowel sounds are normal. There is no distension.     Palpations: Abdomen is soft. There is no mass.     Tenderness: There is no abdominal tenderness.  Musculoskeletal:        General: No deformity. Normal range of motion.     Cervical back: Normal range of motion and neck supple.  Skin:    General: Skin is warm and  dry.     Findings: No erythema or rash.  Neurological:     Mental Status: She is alert and oriented to person, place, and time. Mental status is at baseline.     Cranial Nerves: No cranial nerve deficit.     Coordination: Coordination normal.  Psychiatric:        Mood and Affect: Mood normal.     LABORATORY DATA:  I have reviewed the data as listed Lab Results  Component Value Date   WBC 8.4 08/10/2019   HGB 13.4 08/10/2019   HCT 41.9 08/10/2019   MCV 83.8 08/10/2019   PLT 185 08/10/2019   No results for input(s): NA, K, CL, CO2, GLUCOSE, BUN, CREATININE, CALCIUM, GFRNONAA, GFRAA, PROT, ALBUMIN, AST, ALT, ALKPHOS, BILITOT, BILIDIR, IBILI in the last 8760 hours. Iron/TIBC/Ferritin/ %Sat    Component Value Date/Time   IRON 59 08/10/2019 1254   IRON 26 (L) 04/29/2018 1417   TIBC 392 08/10/2019 1254   TIBC 410 04/29/2018 1417   FERRITIN 13 08/10/2019 1254   FERRITIN 8 (L) 04/29/2018 1417   IRONPCTSAT 15 08/10/2019 1254   IRONPCTSAT 6 (LL) 04/29/2018 1417     RADIOGRAPHIC STUDIES: I have personally reviewed the radiological images as listed and agreed with the findings in the  report. No results found.  ASSESSMENT & PLAN:  1. Iron deficiency anemia due to chronic blood loss   #Iron deficiency due to ulcerative colitis Labs are reviewed and discussed with patient. Ferritin is borderline.  In the context of ulcerative colitis, I recommend her to proceed with IV venofer 253m x 1.  She agrees with the plan.   Chronic hypercalcemia, with elevated PTH, likely primary hyperparathyroidism.  [facility] and she did not go    Orders Placed This Encounter  Procedures  . CBC with Differential/Platelet    Standing Status:   Future    Standing Expiration Date:   08/11/2020  . Comprehensive metabolic panel    Standing Status:   Future    Standing Expiration Date:   08/11/2020  . Ferritin    Standing Status:   Future    Standing Expiration Date:   08/11/2020  . Iron and TIBC    Standing Status:   Future    Standing Expiration Date:   08/11/2020    All questions were answered. The patient knows to call the clinic with any problems questions or concerns.   ZEarlie Server MD, PhD Hematology Oncology CP[facility] Divisionat AWestern State HospitalPager- [phone]/15/2021

## 2019-08-17 ENCOUNTER — Other Ambulatory Visit: Payer: Self-pay | Admitting: Sports Medicine

## 2019-08-17 ENCOUNTER — Other Ambulatory Visit: Payer: Self-pay | Admitting: Family Medicine

## 2019-08-17 DIAGNOSIS — Z1231 Encounter for screening mammogram for malignant neoplasm of breast: Secondary | ICD-10-CM

## 2019-08-18 ENCOUNTER — Telehealth: Payer: Self-pay | Admitting: Gastroenterology

## 2019-08-18 DIAGNOSIS — K51011 Ulcerative (chronic) pancolitis with rectal bleeding: Secondary | ICD-10-CM

## 2019-08-18 NOTE — Telephone Encounter (Signed)
Called and left a message for call back. Patient is on Humira pen. Patient had a infusion on 08/12/2019

## 2019-08-18 NOTE — Telephone Encounter (Signed)
Patient having problems with hand shaking at night, is this a medication reaction.  She is worried her hemagoblin in low due to her iron infusion.  Please call patient.

## 2019-08-19 NOTE — Telephone Encounter (Signed)
Unlikely due to humira- should probably get a CBC,CMP,CRP and followed by office visit to discuss further

## 2019-08-22 ENCOUNTER — Other Ambulatory Visit: Payer: Self-pay

## 2019-08-22 DIAGNOSIS — K51011 Ulcerative (chronic) pancolitis with rectal bleeding: Secondary | ICD-10-CM

## 2019-08-22 NOTE — Telephone Encounter (Signed)
Patient verbalize understanding of instructions. Made follow up appointment and will get blood work

## 2019-09-13 ENCOUNTER — Ambulatory Visit
Admission: RE | Admit: 2019-09-13 | Discharge: 2019-09-13 | Disposition: A | Discharge status: Home / Self Care | Payer: Medicare HMO | Source: Ambulatory Visit | Attending: Sports Medicine | Admitting: Sports Medicine

## 2019-09-13 DIAGNOSIS — Z1231 Encounter for screening mammogram for malignant neoplasm of breast: Secondary | ICD-10-CM | POA: Diagnosis present

## 2019-09-23 ENCOUNTER — Telehealth: Payer: Self-pay | Admitting: *Deleted

## 2019-09-23 NOTE — Telephone Encounter (Signed)
Pt has lost her card proving that she has had both doses of Covid Vaccine.  Pt has since lost her card. Pt  would like to know how to get a copy of her record of having both vaccines. Was going to go to a clinic to ask for copy but she is visually impaired and does not have transportation. I told pt that I will find out the answer and get back with her. Verbalized understanding.

## 2019-09-26 ENCOUNTER — Telehealth: Payer: Self-pay | Admitting: Gastroenterology

## 2019-09-26 NOTE — Telephone Encounter (Signed)
Patient called & l/m on v/m asking for DR Anna's nurse to call her.

## 2019-09-27 NOTE — Telephone Encounter (Signed)
Patient states that she will come to our office to have lab work done before her appointment. She is going to set up transportation and come

## 2019-10-06 NOTE — Telephone Encounter (Deleted)
Patient calling back to inquire about getting vaccination card mailed to her.

## 2019-10-11 LAB — COMPREHENSIVE METABOLIC PANEL
ALT: 25 IU/L (ref 0–32)
AST: 19 IU/L (ref 0–40)
Albumin/Globulin Ratio: 1.3 (ref 1.2–2.2)
Albumin: 4.1 g/dL (ref 3.8–4.8)
Alkaline Phosphatase: 156 IU/L — ABNORMAL HIGH (ref 48–121)
BUN/Creatinine Ratio: 13 (ref 12–28)
BUN: 12 mg/dL (ref 8–27)
Bilirubin Total: 0.6 mg/dL (ref 0.0–1.2)
CO2: 22 mmol/L (ref 20–29)
Calcium: 10.8 mg/dL — ABNORMAL HIGH (ref 8.7–10.3)
Chloride: 109 mmol/L — ABNORMAL HIGH (ref 96–106)
Creatinine, Ser: 0.9 mg/dL (ref 0.57–1.00)
GFR calc Af Amer: 79 mL/min/{1.73_m2} (ref >59)
GFR calc non Af Amer: 68 mL/min/{1.73_m2} (ref >59)
Globulin, Total: 3.2 g/dL (ref 1.5–4.5)
Glucose: 84 mg/dL (ref 65–99)
Potassium: 4.7 mmol/L (ref 3.5–5.2)
Sodium: 144 mmol/L (ref 134–144)
Total Protein: 7.3 g/dL (ref 6.0–8.5)

## 2019-10-11 LAB — CBC
Hematocrit: 44.1 % (ref 34.0–46.6)
Hemoglobin: 14.2 g/dL (ref 11.1–15.9)
MCH: 26.1 pg — ABNORMAL LOW (ref 26.6–33.0)
MCHC: 32.2 g/dL (ref 31.5–35.7)
MCV: 81 fL (ref 79–97)
Platelets: 163 10*3/uL (ref 150–450)
RBC: 5.45 x10E6/uL — ABNORMAL HIGH (ref 3.77–5.28)
RDW: 14.1 % (ref 11.7–15.4)
WBC: 8.4 10*3/uL (ref 3.4–10.8)

## 2019-10-11 LAB — C-REACTIVE PROTEIN: CRP: 7 mg/L (ref 0–10)

## 2019-10-12 ENCOUNTER — Ambulatory Visit (INDEPENDENT_AMBULATORY_CARE_PROVIDER_SITE_OTHER): Payer: Medicare HMO | Admitting: Gastroenterology

## 2019-10-12 ENCOUNTER — Other Ambulatory Visit: Payer: Self-pay

## 2019-10-12 VITALS — BP 132/87 | HR 103 | Temp 98.2°F | Ht 67.0 in | Wt 254.0 lb

## 2019-10-12 DIAGNOSIS — M8580 Other specified disorders of bone density and structure, unspecified site: Secondary | ICD-10-CM

## 2019-10-12 DIAGNOSIS — K51011 Ulcerative (chronic) pancolitis with rectal bleeding: Secondary | ICD-10-CM | POA: Diagnosis not present

## 2019-10-12 NOTE — Progress Notes (Signed)
Tiffany Bellows MD, MRCP(U.K) [address]  [place], [place] [address]  Main: [phone]  Fax: [phone]   Primary Care Physician: Burnis Kingfisher, MD  Primary Gastroenterologist:  Dr. Jonathon Rosales   Chief Complaint  Patient presents with  . Follow-up    HPI: Tiffany Rosales is a 64 y.o. female   Summary of history : She was initially referred and seen by me on 01/01/17.She was previously seen at [place]. H/o ulcerative colitis diagnosed in 05/2014 . Tried treatment with ASA and failed. Then was placed on Imuran but was stopped due to Dizziness and headaches.She was subsequently started on Humira , appears was started around 8/2017Treated with prednisone during flares. CT abdomen in 05/2014 showed diffuse mild colitis. Colonoscopy 07/2015 - moderately active pan colitis upto hepatic flexure.   01/30/17: MRE: no evidence of active inflammation of small or large bowel   03/2017: low vitamin D on replacement, TB quant ,HIVnegative, CMP-normal. Hep C was negative, not immune to hep B, hep A IGG not checked, Hb 12.5 with MCV 79 09/2017 : TB quanteferon- negative   Colonoscopy 05/2017 - TI bx- normal, Rt colon bx - normal , 5 polyps x adenomas excised . No colitis in Tx colon , left clon ,sigmoid colon , rectum July 2019: localized mild inflammation seen at the sigmoid colon with rest of the left colon appearing normal. Bx showed moderate active colitis- no HSV/CMV.   Interval history1/15/2020-10/12/2019  03/22/2019 DEXA scan T score of -2.1. 10/10/2019: CMP demonstrates an elevated alkaline phosphatase of 156, CRP of 7, hemoglobin of 14.2 and a platelet count of 163    Takes Humira every week, no rectal bleeding.Fees well no other complaints.   Current Outpatient Medications  Medication Sig Dispense Refill  . Acetaminophen 500 MG coapsule     . Adalimumab (HUMIRA PEN) 40 MG/0.4ML PNKT Inject 40 mg into the skin once a week. 12 each 3  . lisinopril  (PRINIVIL,ZESTRIL) 10 MG tablet     . Multiple Vitamins-Minerals (MULTIVITAMIN WITH MINERALS) tablet Take 1 tablet by mouth daily.    Marland Kitchen oxyCODONE-acetaminophen (PERCOCET) 7.5-325 MG tablet every 6 (six) hours as needed.      No current facility-administered medications for this visit.    Allergies as of 10/12/2019 - Review Complete 10/12/2019  Allergen Reaction Noted  . Other Other (See Comments) 07/02/2011    ROS:  General: Negative for anorexia, weight loss, fever, chills, fatigue, weakness. ENT: Negative for hoarseness, difficulty swallowing , nasal congestion. CV: Negative for chest pain, angina, palpitations, dyspnea on exertion, peripheral edema.  Respiratory: Negative for dyspnea at rest, dyspnea on exertion, cough, sputum, wheezing.  GI: See history of present illness. GU:  Negative for dysuria, hematuria, urinary incontinence, urinary frequency, nocturnal urination.  Endo: Negative for unusual weight change.    Physical Examination:   BP 132/87   Pulse (!) 103   Temp 98.2 F (36.8 C)   Ht 5' 7"  (1.702 m)   Wt 254 lb (115.2 kg)   BMI 39.78 kg/m   General: Well-nourished, well-developed in no acute distress.  Eyes: No icterus. Conjunctivae pink. Mouth: Oropharyngeal mucosa moist and pink , no lesions erythema or exudate. Lungs: Clear to auscultation bilaterally. Non-labored. Heart: Regular rate and rhythm, no murmurs rubs or gallops.  Abdomen: Bowel sounds are normal, nontender, nondistended, no hepatosplenomegaly or masses, no abdominal bruits or hernia , no rebound or guarding.   Extremities: No lower extremity edema. No clubbing or deformities.  Neuro: Alert and oriented x 3.  Grossly intact. Skin: Warm and dry, no jaundice.   Psych: Alert and cooperative, normal mood and affect.   Imaging Studies: MM 3D SCREEN BREAST BILATERAL  Result Date: 09/15/2019 CLINICAL DATA:  Screening. EXAM: DIGITAL SCREENING BILATERAL MAMMOGRAM WITH TOMO AND CAD COMPARISON:   Previous exam(s). ACR Breast Density Category b: There are scattered areas of fibroglandular density. FINDINGS: There are no findings suspicious for malignancy. Images were processed with CAD. IMPRESSION: No mammographic evidence of malignancy. A result letter of this screening mammogram will be mailed directly to the patient. RECOMMENDATION: Screening mammogram in one year. (Code:SM-B-01Y) BI-RADS CATEGORY  1: Negative. Electronically Signed   By: Claudie Revering M.D.   On: 09/15/2019 09:34    Assessment and Plan:   Tiffany Rosales is a 64 y.o. y/o female here to follow up forulcerative pan colitis diagnosed in early 2016 , failed ASA,Imuran and now on weekly Humira since 8/2017every week. Clinically appears to be in remission at this time. Updated on need for regular health maintenance.   03/20/2019 DEXA scan shows osteopenia  Plan  1. DEXA scan to be repeated in December 2022 2.No NSAID's 3.  With a history of osteopenia, inflammatory bowel disease and prior history of steroid use being postmenopausal I will suggest her to see a rheumatologist today to mind treatment with pharmacological agents for her osteopenia. 4. Continue weekly humira. 5. Colonoscopyfor colon cancer surveillance in 05/2020 6.  Check vitamin D, TB QuantiFERON, gamma GT, PTH calcium due to elevated alkaline phosphatase.  If GGT is elevated will need further liver work-up to determine etiology of elevated alkaline phosphatase.   Dr Tiffany Bellows  MD,MRCP [facility]) Follow up in 6 months

## 2019-10-15 LAB — PTH, INTACT AND CALCIUM
Calcium: 10.3 mg/dL (ref 8.7–10.3)
PTH: 79 pg/mL — ABNORMAL HIGH (ref 15–65)

## 2019-10-15 LAB — QUANTIFERON-TB GOLD PLUS
QuantiFERON Mitogen Value: 0 IU/mL
QuantiFERON Nil Value: 0 IU/mL
QuantiFERON TB1 Ag Value: 0 IU/mL
QuantiFERON TB2 Ag Value: 0 IU/mL
QuantiFERON-TB Gold Plus: UNDETERMINED — AB

## 2019-10-15 LAB — GAMMA GT: GGT: 28 IU/L (ref 0–60)

## 2019-10-26 ENCOUNTER — Telehealth: Payer: Self-pay

## 2019-10-26 DIAGNOSIS — K51011 Ulcerative (chronic) pancolitis with rectal bleeding: Secondary | ICD-10-CM

## 2019-10-26 DIAGNOSIS — R7612 Nonspecific reaction to cell mediated immunity measurement of gamma interferon antigen response without active tuberculosis: Secondary | ICD-10-CM

## 2019-10-26 NOTE — Telephone Encounter (Signed)
-----   Message from Jonathon Bellows, MD sent at 10/20/2019 11:09 AM EDT ----- 1.  TB test is indeterminant referred to ID since the patient is on a biological agent?  Do we need to do anything more is a question for infectious diseases  2.  Parathormone level is elevated.  Can recheck vitamin D level.

## 2019-10-26 NOTE — Telephone Encounter (Signed)
Pt has been notified of results and Dr. Georgeann Oppenheim recommendations. Pt agrees to referral.

## 2019-11-08 ENCOUNTER — Ambulatory Visit: Payer: Medicare HMO | Attending: Infectious Diseases | Admitting: Infectious Diseases

## 2019-11-08 ENCOUNTER — Encounter: Payer: Self-pay | Admitting: Infectious Diseases

## 2019-11-08 ENCOUNTER — Other Ambulatory Visit
Admission: RE | Admit: 2019-11-08 | Discharge: 2019-11-08 | Disposition: A | Discharge status: Home / Self Care | Payer: Medicare HMO | Source: Ambulatory Visit | Attending: Infectious Diseases | Admitting: Infectious Diseases

## 2019-11-08 ENCOUNTER — Other Ambulatory Visit: Payer: Self-pay | Admitting: Infectious Diseases

## 2019-11-08 ENCOUNTER — Other Ambulatory Visit: Payer: Self-pay

## 2019-11-08 VITALS — BP 128/77 | HR 106 | Temp 98.5°F | Resp 16 | Ht 67.0 in | Wt 254.0 lb

## 2019-11-08 DIAGNOSIS — R7612 Nonspecific reaction to cell mediated immunity measurement of gamma interferon antigen response without active tuberculosis: Secondary | ICD-10-CM

## 2019-11-08 DIAGNOSIS — Z111 Encounter for screening for respiratory tuberculosis: Secondary | ICD-10-CM

## 2019-11-08 DIAGNOSIS — Z79899 Other long term (current) drug therapy: Secondary | ICD-10-CM | POA: Diagnosis not present

## 2019-11-08 DIAGNOSIS — I10 Essential (primary) hypertension: Secondary | ICD-10-CM | POA: Insufficient documentation

## 2019-11-08 DIAGNOSIS — K5190 Ulcerative colitis, unspecified, without complications: Secondary | ICD-10-CM | POA: Insufficient documentation

## 2019-11-08 DIAGNOSIS — D500 Iron deficiency anemia secondary to blood loss (chronic): Secondary | ICD-10-CM | POA: Diagnosis not present

## 2019-11-08 LAB — IRON AND TIBC
Iron: 60 ug/dL (ref 28–170)
Saturation Ratios: 17 % (ref 10.4–31.8)
TIBC: 364 ug/dL (ref 250–450)
UIBC: 304 ug/dL

## 2019-11-08 NOTE — Patient Instructions (Signed)
You are here for an abnormal test- today I repeated the test and also did PPD- will see you in 48 hrs to read the result- do not rub or scratch the area

## 2019-11-08 NOTE — Progress Notes (Addendum)
NAME: Tiffany Rosales  DOB: 07/29/1955  MRN: [id number]  Date/Time: 11/08/2019 11:44 AM  REQUESTING PROVIDER Vicente Males Subjective:  REASON FOR CONSULT: indeterminate quantiferon gold ? Tiffany Rosales is a 64 y.o. female with a history of ulcerative colitis on humira Is referred to me for an indeterminate quantiferon gold Pt has no fever or chills, no weight loss or loss of appetitie Her last quantiferon in 10/22/2017 was negative at [facility]  Past Medical History:  Diagnosis Date  . Anemia   . Colitis   . Detached retina   . Hypertension   . Vision impairment     Past Surgical History:  Procedure Laterality Date  . ABDOMINAL HYSTERECTOMY    . COLONOSCOPY WITH PROPOFOL N/A 06/09/2017   Procedure: COLONOSCOPY WITH PROPOFOL;  Surgeon: Jonathon Bellows, MD;  Location[facility] ENDOSCOPY;  Service: Gastroenterology;  Laterality: N/A;  . ESOPHAGOGASTRODUODENOSCOPY (EGD) WITH PROPOFOL N/A 05/11/2018   Procedure: ESOPHAGOGASTRODUODENOSCOPY (EGD) WITH PROPOFOL;  Surgeon: Jonathon Bellows, MD;  Location: [facility] ENDOSCOPY;  Service: Gastroenterology;  Laterality: N/A;  . EYE SURGERY     1982  . FLEXIBLE SIGMOIDOSCOPY N/A 10/29/2017   Procedure: FLEXIBLE SIGMOIDOSCOPY;  Surgeon: Jonathon Bellows, MD;  Location: [facility] ENDOSCOPY;  Service: Gastroenterology;  Laterality: N/A;    Social History   Socioeconomic History  . Marital status: Single    Spouse name: Not on file  . Number of children: Not on file  . Years of education: Not on file  . Highest education level: Not on file  Occupational History  . Not on file  Tobacco Use  . Smoking status: Never Smoker  . Smokeless tobacco: Never Used  Vaping Use  . Vaping Use: Never used  Substance and Sexual Activity  . Alcohol use: No  . Drug use: No  . Sexual activity: Never  Other Topics Concern  . Not on file  Social History Narrative  . Not on file   Social Determinants of Health   Financial Resource Strain:   . Difficulty of Paying Living Expenses:     Food Insecurity:   . Worried About Charity fundraiser in the Last Year:   . Arboriculturist in the Last Year:   Transportation Needs:   . Film/video editor (Medical):   Marland Kitchen Lack of Transportation (Non-Medical):   Physical Activity:   . Days of Exercise per Week:   . Minutes of Exercise per Session:   Stress:   . Feeling of Stress :   Social Connections:   . Frequency of Communication with Friends and Family:   . Frequency of Social Gatherings with Friends and Family:   . Attends Religious Services:   . Active Member of Clubs or Organizations:   . Attends Archivist Meetings:   Marland Kitchen Marital Status:   Intimate Partner Violence:   . Fear of Current or Ex-Partner:   . Emotionally Abused:   Marland Kitchen Physically Abused:   . Sexually Abused:     Family History  Problem Relation Age of Onset  . Breast cancer Mother   . Kidney disease Mother   . Hypertension Sister   . Hypertension Brother   . Breast cancer Cousin   . Breast cancer Cousin    Allergies  Allergen Reactions  . Other Other (See Comments)    Seasonal allergies:  Eyes water    ? Current Outpatient Medications  Medication Sig Dispense Refill  . Acetaminophen 500 MG coapsule     . Adalimumab (HUMIRA PEN) 40  MG/0.4ML PNKT Inject 40 mg into the skin once a week. 12 each 3  . lisinopril (PRINIVIL,ZESTRIL) 10 MG tablet     . oxyCODONE-acetaminophen (PERCOCET) 7.5-325 MG tablet every 6 (six) hours as needed.     . Multiple Vitamins-Minerals (MULTIVITAMIN WITH MINERALS) tablet Take 1 tablet by mouth daily. (Patient not taking: Reported on 11/08/2019)     No current facility-administered medications for this visit.     Abtx:  Anti-infectives (From admission, onward)   None      REVIEW OF SYSTEMS:  Const: negative fever, negative chills, negative weight loss Eyes: poor vision-  ENT: negative coryza, negative sore throat Resp: negative cough, hemoptysis, dyspnea Cards: negative for chest pain, palpitations,  lower extremity edema GU: negative for frequency, dysuria and hematuria GI: Negative for abdominal pain, diarrhea, bleeding, constipation Skin: negative for rash and pruritus Heme: negative for easy bruising and gum/nose bleeding MS: negative for myalgias, arthralgias, back pain and muscle weakness Neurolo:negative for headaches, dizziness, vertigo, memory problems  Psych: negative for feelings of anxiety, depression  Endocrine: negative for thyroid, diabetes Allergy/Immunology- negative for any medication or food allergies  Objective:  VITALS:  BP 128/77   Pulse (!) 106   Temp 98.5 F (36.9 C)   Resp 16   Ht 5' 7"  (1.702 m)   Wt 254 lb (115.2 kg)   SpO2 93%   BMI 39.78 kg/m  PHYSICAL EXAM:  General: Alert, cooperative, no distress, appears stated age.  Head: Normocephalic, without obvious abnormality, atraumatic. Eyes: legally blind ENTdid not examine Neck: Supple, symmetrical, no adenopathy, thyroid: non tender no carotid bruit and no JVD. Back: No CVA tenderness. Lungs: Clear to auscultation bilaterally. No Wheezing or Rhonchi. No rales. Heart: Regular rate and rhythm, no murmur, rub or gallop. Abdomen: Soft, non-tender,not distended. Bowel sounds normal. No masses Extremities: atraumatic, no cyanosis. No edema. No clubbing Skin: No rashes or lesions. Or bruising Lymph: Cervical, supraclavicular normal. Neurologic: Grossly non-focal    Pertinent Labs Lab Results CBC    Component Value Date/Time   WBC 8.4 10/10/2019 1318   WBC 8.4 08/10/2019 1254   RBC 5.45 (H) 10/10/2019 1318   RBC 5.00 08/10/2019 1254   HGB 14.2 10/10/2019 1318   HCT 44.1 10/10/2019 1318   PLT 163 10/10/2019 1318   MCV 81 10/10/2019 1318   MCH 26.1 (L) 10/10/2019 1318   MCH 26.8 08/10/2019 1254   MCHC 32.2 10/10/2019 1318   MCHC 32.0 08/10/2019 1254   RDW 14.1 10/10/2019 1318   LYMPHSABS 2.9 08/10/2019 1254   LYMPHSABS 3.0 04/14/2018 1103   MONOABS 0.5 08/10/2019 1254   EOSABS 0.2  08/10/2019 1254   EOSABS 0.2 04/14/2018 1103   BASOSABS 0.0 08/10/2019 1254   BASOSABS 0.1 04/14/2018 1103    CMP Latest Ref Rng & Units 10/12/2019 10/10/2019 04/29/2018  Glucose 65 - 99 mg/dL - 84 -  BUN 8 - 27 mg/dL - 12 -  Creatinine 0.57 - 1.00 mg/dL - 0.90 -  Sodium 134 - 144 mmol/L - 144 -  Potassium 3.5 - 5.2 mmol/L - 4.7 -  Chloride 96 - 106 mmol/L - 109(H) -  CO2 20 - 29 mmol/L - 22 -  Calcium 8.7 - 10.3 mg/dL 10.3 10.8(H) 10.8(H)  Total Protein 6.0 - 8.5 g/dL - 7.3 -  Total Bilirubin 0.0 - 1.2 mg/dL - 0.6 -  Alkaline Phos 48 - 121 IU/L - 156(H) -  AST 0 - 40 IU/L - 19 -  ALT 0 - 32  IU/L - 25 -     Impression/Recommendation Pt with ulcerative colitis on Humira Had a routine quantiferongold which was indeterminate Placed PPD today Will also repeat  quantiferon She has no symptoms to suggest of TB But because she is on TNFI will need to assess this and if needed a CXR ? Follow up on thursday? ___________________________________________________ Discussed with patient,Note:  This document was prepared using Dragon voice recognition software and may include unintentional dictation errors.   Pt came back on 11/10/19-PPD negative  Repeat quantiferon gold is negative No latent TB

## 2019-11-10 ENCOUNTER — Other Ambulatory Visit: Payer: Self-pay

## 2019-11-10 ENCOUNTER — Ambulatory Visit: Payer: Medicare HMO | Attending: Infectious Diseases

## 2019-11-10 LAB — QUANTIFERON-TB GOLD PLUS (RQFGPL)
QuantiFERON Mitogen Value: >10 IU/mL
QuantiFERON Nil Value: 0.13 IU/mL
QuantiFERON TB1 Ag Value: 0.03 IU/mL
QuantiFERON TB2 Ag Value: 0.04 IU/mL

## 2019-11-10 LAB — QUANTIFERON-TB GOLD PLUS: QuantiFERON-TB Gold Plus: NEGATIVE

## 2019-11-10 NOTE — Progress Notes (Unsigned)
Erroneous

## 2019-11-12 LAB — VITAMIN D 1,25 DIHYDROXY
Vitamin D 1, 25 (OH)2 Total: 122 pg/mL — ABNORMAL HIGH
Vitamin D2 1, 25 (OH)2: <10 pg/mL
Vitamin D3 1, 25 (OH)2: 122 pg/mL

## 2019-11-17 ENCOUNTER — Telehealth: Payer: Self-pay

## 2019-11-17 DIAGNOSIS — E349 Endocrine disorder, unspecified: Secondary | ICD-10-CM

## 2019-11-17 NOTE — Telephone Encounter (Signed)
Pt has been notified of results and Dr. Georgeann Oppenheim recommendations. Pt agrees to the endocrinology referral.

## 2019-11-17 NOTE — Telephone Encounter (Signed)
-----   Message from Jonathon Bellows, MD sent at 11/16/2019  9:21 AM EDT ----- Vitamin D levels are normal and PTH is elevated - refer to endocrinology

## 2020-01-27 ENCOUNTER — Telehealth: Payer: Self-pay | Admitting: Gastroenterology

## 2020-01-27 NOTE — Telephone Encounter (Signed)
Patient states she needs a refill faxed in for her humira pen. Pt states it is used every Friday. Pt requesting call back when done.

## 2020-01-27 NOTE — Telephone Encounter (Signed)
Refill faxed, pt has been notified.

## 2020-08-03 ENCOUNTER — Telehealth: Payer: Self-pay | Admitting: Oncology

## 2020-08-03 ENCOUNTER — Telehealth: Payer: Self-pay

## 2020-08-03 NOTE — Telephone Encounter (Signed)
-----   Message from Secundino Ginger sent at 08/03/2020  1:31 PM EDT ----- Regarding: cancellation This patient called and wants to cancel her lab on 5/11 and her follow up / infusion with Dr Tasia Catchings on 5/13. SHe went to her primary DR at [facility] and they did labs and her iron was fine. She said she doesn't want to reschedule at this time and will call back when she wants a follow up.I did not cancel the appt. Since I'm not in scheduling anymore. But I will if you want me too.

## 2020-08-03 NOTE — Telephone Encounter (Signed)
Dr. Tasia Catchings please advise? Does she need a follow up in the near future or just wait for her to call back and r/s per her request.

## 2020-08-03 NOTE — Telephone Encounter (Signed)
This patient called and wants to cancel her lab on 5/11 and her follow up / infusion with Dr Tasia Catchings on 5/13. I called her back and she said that she went to her primary DR at [facility] and they did labs and her iron was fine. She said she doesn't want to reschedule at this time and will call back when she wants a follow up.

## 2020-08-08 ENCOUNTER — Inpatient Hospital Stay: Payer: Medicare HMO

## 2020-08-10 ENCOUNTER — Inpatient Hospital Stay: Payer: Medicare HMO | Admitting: Oncology

## 2020-08-10 ENCOUNTER — Inpatient Hospital Stay: Payer: Medicare HMO

## 2020-09-03 ENCOUNTER — Other Ambulatory Visit: Payer: Self-pay | Admitting: Internal Medicine

## 2020-09-03 DIAGNOSIS — Z1231 Encounter for screening mammogram for malignant neoplasm of breast: Secondary | ICD-10-CM

## 2020-09-13 ENCOUNTER — Other Ambulatory Visit: Payer: Self-pay

## 2020-09-13 ENCOUNTER — Ambulatory Visit
Admission: RE | Admit: 2020-09-13 | Discharge: 2020-09-13 | Disposition: A | Discharge status: Home / Self Care | Payer: Medicare HMO | Source: Ambulatory Visit | Attending: Internal Medicine | Admitting: Internal Medicine

## 2020-09-13 DIAGNOSIS — Z1231 Encounter for screening mammogram for malignant neoplasm of breast: Secondary | ICD-10-CM | POA: Insufficient documentation

## 2020-12-19 ENCOUNTER — Telehealth: Payer: Self-pay

## 2020-12-19 NOTE — Telephone Encounter (Signed)
Pt. Ready to schedule procedure.Marland Kitchen

## 2020-12-20 ENCOUNTER — Telehealth: Payer: Self-pay

## 2020-12-20 NOTE — Telephone Encounter (Signed)
Called patient no answer phone just rang til it hung up no way to leave a message

## 2020-12-27 ENCOUNTER — Telehealth: Payer: Self-pay

## 2020-12-27 NOTE — Telephone Encounter (Signed)
Pt. Calling to schedule a colonoscopy. Pt was told that we had been trying to contact her. She said try her on her cell phone.

## 2020-12-27 NOTE — Telephone Encounter (Signed)
Phone just rang no answer

## 2021-01-08 ENCOUNTER — Telehealth: Payer: Self-pay | Admitting: Gastroenterology

## 2021-01-08 NOTE — Telephone Encounter (Signed)
Pt. Requesting a call back she really does not know what she wants to ask

## 2021-01-09 ENCOUNTER — Other Ambulatory Visit: Payer: Self-pay

## 2021-01-09 DIAGNOSIS — Z86010 Personal history of colonic polyps: Secondary | ICD-10-CM

## 2021-01-09 MED ORDER — NA SULFATE-K SULFATE-MG SULF 17.5-3.13-1.6 GM/177ML PO SOLN: 354.0000 mL | Freq: Once | ORAL | 0 refills | Status: AC

## 2021-01-09 NOTE — Telephone Encounter (Signed)
Patient was called back and she stated that she thinks that she needed to schedule a repeat colonoscopy. I checked her chart and she was right, I told her that her colonoscopy was to be done on March of this year. So, I then asked her when she would like to schedule it and she stated that until next year in January. Therefore, at the end, she requested April 05, 2021. Patient also stated that she will have the same insurance next year.

## 2021-04-04 ENCOUNTER — Other Ambulatory Visit: Payer: Self-pay

## 2021-04-04 MED ORDER — PEG 3350-KCL-NA BICARB-NACL 420 G PO SOLR: 4000.0000 mL | Freq: Once | ORAL | 0 refills | Status: AC

## 2021-04-04 NOTE — Progress Notes (Signed)
Patient is calling because she states that the prep that was called in is not covered by her insurance she would like nulytely called in to the pharmacy. Called in medication and gave her instructions on how to take the medication

## 2021-04-04 NOTE — Anesthesia Preprocedure Evaluation (Addendum)
Anesthesia Evaluation   Patient awake    Reviewed: Allergy & Precautions, Patient's Chart, lab work & pertinent test results  Airway Mallampati: II  TM Distance: >3 FB Neck ROM: Full    Dental no notable dental hx.    Pulmonary    Pulmonary exam normal breath sounds clear to auscultation       Cardiovascular hypertension, Normal cardiovascular exam Rhythm:Regular Rate:Normal     Neuro/Psych    GI/Hepatic PUD, ULC COLITIS   Endo/Other  Morbid obesity  Renal/GU      Musculoskeletal   Abdominal   Peds  Hematology  (+) anemia ,   Anesthesia Other Findings   Reproductive/Obstetrics                            Anesthesia Physical Anesthesia Plan  ASA: 3  Anesthesia Plan: General   Post-op Pain Management:    Induction: Intravenous  PONV Risk Score and Plan:   Airway Management Planned: Natural Airway and Nasal Cannula  Additional Equipment:   Intra-op Plan:   Post-operative Plan:   Informed Consent: I have reviewed the patients History and Physical, chart, labs and discussed the procedure including the risks, benefits and alternatives for the proposed anesthesia with the patient or authorized representative who has indicated his/her understanding and acceptance.     Dental Advisory Given  Plan Discussed with: Anesthesiologist, CRNA and Surgeon  Anesthesia Plan Comments: (Patient consented for risks of anesthesia including but not limited to:  - adverse reactions to medications - risk of airway placement if required - damage to eyes, teeth, lips or other oral mucosa - nerve damage due to positioning  - sore throat or hoarseness - Damage to heart, brain, nerves, lungs, other parts of body or loss of life  Patient voiced understanding.)        Anesthesia Quick Evaluation

## 2021-04-05 ENCOUNTER — Encounter
Admission: RE | Disposition: A | Discharge status: Home / Self Care | Payer: Self-pay | Source: Home / Self Care | Attending: Gastroenterology

## 2021-04-05 ENCOUNTER — Ambulatory Visit: Payer: Medicare HMO | Admitting: Anesthesiology

## 2021-04-05 ENCOUNTER — Ambulatory Visit
Admission: RE | Admit: 2021-04-05 | Discharge: 2021-04-05 | Disposition: A | Discharge status: Home / Self Care | Payer: Medicare HMO | Attending: Gastroenterology | Admitting: Gastroenterology

## 2021-04-05 ENCOUNTER — Encounter: Payer: Self-pay | Admitting: Gastroenterology

## 2021-04-05 DIAGNOSIS — I10 Essential (primary) hypertension: Secondary | ICD-10-CM | POA: Diagnosis not present

## 2021-04-05 DIAGNOSIS — D126 Benign neoplasm of colon, unspecified: Secondary | ICD-10-CM

## 2021-04-05 DIAGNOSIS — Z6839 Body mass index (BMI) 39.0-39.9, adult: Secondary | ICD-10-CM | POA: Insufficient documentation

## 2021-04-05 DIAGNOSIS — K6389 Other specified diseases of intestine: Secondary | ICD-10-CM | POA: Insufficient documentation

## 2021-04-05 DIAGNOSIS — Z8711 Personal history of peptic ulcer disease: Secondary | ICD-10-CM | POA: Insufficient documentation

## 2021-04-05 DIAGNOSIS — D120 Benign neoplasm of cecum: Secondary | ICD-10-CM | POA: Insufficient documentation

## 2021-04-05 DIAGNOSIS — Z86010 Personal history of colonic polyps: Secondary | ICD-10-CM

## 2021-04-05 DIAGNOSIS — D123 Benign neoplasm of transverse colon: Secondary | ICD-10-CM | POA: Diagnosis not present

## 2021-04-05 DIAGNOSIS — Z09 Encounter for follow-up examination after completed treatment for conditions other than malignant neoplasm: Secondary | ICD-10-CM | POA: Diagnosis present

## 2021-04-05 HISTORY — PX: COLONOSCOPY WITH PROPOFOL: SHX5780

## 2021-04-05 SURGERY — COLONOSCOPY WITH PROPOFOL
Anesthesia: General

## 2021-04-05 MED ORDER — SODIUM CHLORIDE 0.9 % IV SOLN: INTRAVENOUS | Status: DC

## 2021-04-05 MED ORDER — STERILE WATER FOR IRRIGATION IR SOLN
Status: DC | PRN
  Administered 2021-04-05: 60 mL

## 2021-04-05 MED ORDER — PROPOFOL 10 MG/ML IV BOLUS
INTRAVENOUS | Status: DC | PRN
  Administered 2021-04-05: 50 mg via INTRAVENOUS

## 2021-04-05 MED ORDER — PROPOFOL 500 MG/50ML IV EMUL
INTRAVENOUS | Status: DC | PRN
  Administered 2021-04-05: 140 ug/kg/min via INTRAVENOUS

## 2021-04-05 MED ORDER — LIDOCAINE HCL (CARDIAC) PF 100 MG/5ML IV SOSY
PREFILLED_SYRINGE | INTRAVENOUS | Status: DC | PRN
  Administered 2021-04-05: 100 mg via INTRAVENOUS

## 2021-04-05 NOTE — Anesthesia Postprocedure Evaluation (Signed)
Anesthesia Post Note  Patient: Tiffany Rosales  Procedure(s) Performed: COLONOSCOPY WITH PROPOFOL  Anesthesia Type: General Anesthetic complications: no   No notable events documented.   Last Vitals:  Vitals:   04/05/21 0826 04/05/21 0937  BP: (!) 152/105 106/68  Pulse: (!) 115   Resp: 20   Temp: (!) 36 C (!) 35.8 C  SpO2: 96%     Last Pain:  Vitals:   04/05/21 0937  TempSrc: Temporal                 Nocona Blas

## 2021-04-05 NOTE — H&P (Signed)
Jonathon Bellows, MD [address] [place], [place], [place], [address] [id number], [place], [place], [place], [address] Phone: [phone]  Fax: [phone]  Primary Care Physician:  Burnis Kingfisher, MD   Pre-Procedure History & Physical: HPI:  OTELIA HETTINGER is a 66 y.o. female is here for an colonoscopy.   Past Medical History:  Diagnosis Date   Anemia    Colitis    Detached retina    Hypertension    Vision impairment     Past Surgical History:  Procedure Laterality Date   ABDOMINAL HYSTERECTOMY     COLONOSCOPY WITH PROPOFOL N/A 06/09/2017   Procedure: COLONOSCOPY WITH PROPOFOL;  Surgeon: Jonathon Bellows, MD;  Location: [facility] ENDOSCOPY;  Service: Gastroenterology;  Laterality: N/A;   ESOPHAGOGASTRODUODENOSCOPY (EGD) WITH PROPOFOL N/A 05/11/2018   Procedure: ESOPHAGOGASTRODUODENOSCOPY (EGD) WITH PROPOFOL;  Surgeon: Jonathon Bellows, MD;  Location: [facility] ENDOSCOPY;  Service: Gastroenterology;  Laterality: N/A;   EYE SURGERY     1982   FLEXIBLE SIGMOIDOSCOPY N/A 10/29/2017   Procedure: FLEXIBLE SIGMOIDOSCOPY;  Surgeon: Jonathon Bellows, MD;  Location: [facility] ENDOSCOPY;  Service: Gastroenterology;  Laterality: N/A;    Prior to Admission medications   Medication Sig Start Date End Date Taking? Authorizing Provider  lisinopril (PRINIVIL,ZESTRIL) 10 MG tablet  12/21/16  Yes [provider]  Multiple Vitamins-Minerals (MULTIVITAMIN WITH MINERALS) tablet Take 1 tablet by mouth daily.   Yes [provider]  oxyCODONE-acetaminophen (PERCOCET) 7.5-325 MG tablet every 6 (six) hours as needed.  12/05/16  Yes [provider]  Acetaminophen 500 MG coapsule  06/04/17   [provider]  Adalimumab (HUMIRA PEN) 40 MG/0.4ML PNKT Inject 40 mg into the skin once a week. 02/18/19   Jonathon Bellows, MD    Allergies as of 01/09/2021 - Review Complete 11/08/2019  Allergen Reaction Noted   Other Other (See Comments) 07/02/2011    Family History  Problem Relation Age of Onset    Breast cancer Mother    Kidney disease Mother    Hypertension Sister    Hypertension Brother    Breast cancer Cousin    Breast cancer Cousin     Social History   Socioeconomic History   Marital status: Single    Spouse name: Not on file   Number of children: Not on file   Years of education: Not on file   Highest education level: Not on file  Occupational History   Not on file  Tobacco Use   Smoking status: Never   Smokeless tobacco: Never  Vaping Use   Vaping Use: Never used  Substance and Sexual Activity   Alcohol use: No   Drug use: No   Sexual activity: Never  Other Topics Concern   Not on file  Social History Narrative   Not on file   Social Determinants of Health   Financial Resource Strain: Not on file  Food Insecurity: Not on file  Transportation Needs: Not on file  Physical Activity: Not on file  Stress: Not on file  Social Connections: Not on file  Intimate Partner Violence: Not on file    Review of Systems: See HPI, otherwise negative ROS  Physical Exam: BP (!) 152/105    Pulse (!) 115    Temp (!) 96.8 F (36 C) (Temporal)    Resp 20    Ht 5' 7"  (1.702 m)    Wt 113.4 kg    SpO2 96%    BMI 39.16 kg/m  General:   Alert,  pleasant and  cooperative in NAD Head:  Normocephalic and atraumatic. Neck:  Supple; no masses or thyromegaly. Lungs:  Clear throughout to auscultation, normal respiratory effort.    Heart:  +S1, +S2, Regular rate and rhythm, No edema. Abdomen:  Soft, nontender and nondistended. Normal bowel sounds, without guarding, and without rebound.   Neurologic:  Alert and  oriented x4;  grossly normal neurologically.  Impression/Plan: AURELIA GRAS is here for an colonoscopy to be performed for surveillance due to prior history of colon polyps   Risks, benefits, limitations, and alternatives regarding  colonoscopy have been reviewed with the patient.  Questions have been answered.  All parties agreeable.   Jonathon Bellows, MD   04/05/2021, 9:02 AM

## 2021-04-05 NOTE — Op Note (Signed)
[facility] Gastroenterology Patient Name: Tiffany Rosales Procedure Date: 04/05/2021 9:03 AM MRN: [id number] Account #: [id number] Date of Birth: 07-25-1955 Admit Type: Outpatient Age: 66 Room: [facility] ENDO ROOM 4 Gender: Female Note Status: Finalized Instrument Name: Park Meo [id number] Procedure:             Colonoscopy Indications:           Surveillance: Personal history of adenomatous polyps                         on last colonoscopy 3 years ago, Last colonoscopy:                         March 2019 Providers:             Jonathon Bellows MD, MD Referring MD:          Burnis Kingfisher, MD Medicines:             Monitored Anesthesia Care Complications:         No immediate complications. Procedure:             Pre-Anesthesia Assessment:                        - Prior to the procedure, a History and Physical was                         performed, and patient medications, allergies and                         sensitivities were reviewed. The patient's tolerance                         of previous anesthesia was reviewed.                        - The risks and benefits of the procedure and the                         sedation options and risks were discussed with the                         patient. All questions were answered and informed                         consent was obtained.                        - ASA Grade Assessment: II - A patient with mild                         systemic disease.                        After obtaining informed consent, the colonoscope was                         passed under direct vision. Throughout the procedure,                         the patient's blood pressure, pulse,  and oxygen                         saturations were monitored continuously. The                         Colonoscope was introduced through the anus and                         advanced to the the cecum, identified by the                         appendiceal  orifice. The colonoscopy was performed                         with ease. The patient tolerated the procedure well.                         The quality of the bowel preparation was excellent. Findings:      The perianal and digital rectal examinations were normal.      Two sessile polyps were found in the transverse colon and cecum. The       polyps were 4 to 6 mm in size. These polyps were removed with a cold       snare. Resection and retrieval were complete.      A localized area of mildly erythematous mucosa was found in the proximal       descending colon. This was biopsied with a cold forceps for histology.      The exam was otherwise without abnormality on direct and retroflexion       views. Impression:            - Two 4 to 6 mm polyps in the transverse colon and in                         the cecum, removed with a cold snare. Resected and                         retrieved.                        - Erythematous mucosa in the proximal descending                         colon. Biopsied.                        - The examination was otherwise normal on direct and                         retroflexion views. Recommendation:        - Discharge patient to home (with escort).                        - Resume previous diet.                        - Continue present medications.                        - Await  pathology results.                        - Repeat colonoscopy for surveillance based on                         pathology results. Procedure Code(s):     --- Professional ---                        [id number], Colonoscopy, flexible; with removal of                         tumor(s), polyp(s), or other lesion(s) by snare                         technique                        45380, 61, Colonoscopy, flexible; with biopsy, single                         or multiple Diagnosis Code(s):     --- Professional ---                        Z86.010, Personal history of colonic polyps                         K63.5, Polyp of colon                        K63.89, Other specified diseases of intestine CPT copyright 2019 American Medical Association. All rights reserved. The codes documented in this report are preliminary and upon coder review may  be revised to meet current compliance requirements. Jonathon Bellows, MD Jonathon Bellows MD, MD 04/05/2021 9:36:50 AM This report has been signed electronically. Number of Addenda: 0 Note Initiated On: 04/05/2021 9:03 AM Scope Withdrawal Time: 0 hours 11 minutes 57 seconds  Total Procedure Duration: 0 hours 14 minutes 29 seconds  Estimated Blood Loss:  Estimated blood loss: none.      [facility]

## 2021-04-05 NOTE — Transfer of Care (Signed)
Immediate Anesthesia Transfer of Care Note  Patient: ITXEL WICKARD  Procedure(s) Performed: COLONOSCOPY WITH PROPOFOL  Patient Location: Endoscopy Unit  Anesthesia Type:General  Level of Consciousness: awake, alert  and oriented  Airway & Oxygen Therapy: Patient Spontanous Breathing  Post-op Assessment: Report given to RN and Post -op Vital signs reviewed and stable  Post vital signs: Reviewed and stable  Last Vitals:  Vitals Value Taken Time  BP 106/68 04/05/21 0937  Temp 35.8 C 04/05/21 0937  Pulse 108 04/05/21 0940  Resp 38 04/05/21 0940  SpO2 96 % 04/05/21 0940  Vitals shown include unvalidated device data.  Last Pain:  Vitals:   04/05/21 0937  TempSrc: Temporal         Complications: No notable events documented.

## 2021-04-08 ENCOUNTER — Encounter: Payer: Self-pay | Admitting: Gastroenterology

## 2021-04-08 LAB — SURGICAL PATHOLOGY

## 2021-04-09 ENCOUNTER — Other Ambulatory Visit: Payer: Self-pay

## 2021-04-10 ENCOUNTER — Ambulatory Visit (INDEPENDENT_AMBULATORY_CARE_PROVIDER_SITE_OTHER): Payer: Medicare HMO | Admitting: Gastroenterology

## 2021-04-10 ENCOUNTER — Encounter: Payer: Self-pay | Admitting: Gastroenterology

## 2021-04-10 VITALS — BP 161/115 | HR 103 | Temp 99.3°F | Wt 249.0 lb

## 2021-04-10 DIAGNOSIS — Z23 Encounter for immunization: Secondary | ICD-10-CM

## 2021-04-10 DIAGNOSIS — M8580 Other specified disorders of bone density and structure, unspecified site: Secondary | ICD-10-CM | POA: Diagnosis not present

## 2021-04-10 DIAGNOSIS — L732 Hidradenitis suppurativa: Secondary | ICD-10-CM | POA: Diagnosis not present

## 2021-04-10 DIAGNOSIS — K5100 Ulcerative (chronic) pancolitis without complications: Secondary | ICD-10-CM | POA: Diagnosis not present

## 2021-04-10 NOTE — Patient Instructions (Addendum)
We will send a referral to the endocrinologist and they will contact you for an appointment. If you haven't heard from them, please call [phone].  We will also send a referral to a dermatologist to schedule you an appointment. They will also call you and schedule an appointment.  Please call [facility] at [phone] to schedule your bone density appointment.

## 2021-04-10 NOTE — Progress Notes (Signed)
Jonathon Bellows MD, MRCP(U.K) [address]  [place]  [place], [place] [address]  Main: [phone]  Fax: [phone]   Primary Care Physician: Burnis Kingfisher, MD  Primary Gastroenterologist:  Dr. Jonathon Bellows   Chief complaint: Follow-up for ulcerative colitis   HPI: Tiffany Rosales is a 66 y.o. 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 Previously tried treatment with ASA and failed. Then was placed on Imuran but was stopped due to Dizziness and headaches. She was subsequently started on Humira , appears was started around 10/2015. Treated with prednisone during flares.  Colonoscopy 07/2015 - moderately active pan colitis upto hepatic flexure.    01/30/17: MRE: no evidence of active inflammation of small or large bowel  09/2017 : TB quanteferon- negative Colonoscopy 05/2017 - TI bx- normal, Rt colon bx - normal , 5 polyps x adenomas excised . No colitis in Tx colon , left clon ,sigmoid colon , rectum   July 2019: localized mild inflammation seen at the sigmoid colon with rest of the left colon appearing normal. Bx showed moderate active colitis- no HSV/CMV.   03/22/2019 DEXA scan T score of -2.1.    Interval history 10/12/2019-04/10/2021   04/05/2021: Colonoscopy: 04/05/2021: Colonoscopy: 4 to 6 mm size polyps were seen in the transverse colon cecum resected with cold snare.  Localized area of mildly erythematous mucosa seen in the proximal descending colon biopsied. The polyps were tubular adenoma and sessile serrated polyp.  No evidence of colitis was seen endoscopically or on biopsies.   Takes Humira every week, no rectal bleeding.Fees well no other complaints.    She was seen by endocrinology and 12/19/2019 for osteopenia advised to continue daily supplementation of calcium and vitamin D plan was to see her back in 4 to 6 weeks after she did not  follow-up  She is doing well no new complaints.  No GI issues.  No NSAID use.  She complains of discomfort in her armpits   Current Outpatient Medications  Medication Sig Dispense Refill   Acetaminophen 500 MG coapsule      Adalimumab (HUMIRA PEN) 40 MG/0.4ML PNKT Inject 40 mg into the skin once a week. 12 each 3   Cholecalciferol 25 MCG (1000 UT) tablet Take 1 tablet by mouth daily.     lisinopril (PRINIVIL,ZESTRIL) 10 MG tablet      Multiple Vitamins-Minerals (MULTIVITAMIN WITH MINERALS) tablet Take 1 tablet by mouth daily.     oxyCODONE-acetaminophen (PERCOCET) 7.5-325 MG tablet every 6 (six) hours as needed.      No current facility-administered medications for this visit.    Allergies as of 04/10/2021 - Review Complete 04/05/2021  Allergen Reaction Noted   Other Other (See Comments) 07/02/2011    ROS:  General: Negative for anorexia, weight loss, fever, chills, fatigue, weakness. ENT: Negative for hoarseness, difficulty swallowing , nasal congestion. CV: Negative for chest pain, angina, palpitations, dyspnea on exertion, peripheral edema.  Respiratory: Negative for dyspnea at rest, dyspnea on exertion, cough, sputum, wheezing.  GI: See history of present illness. GU:  Negative for dysuria, hematuria, urinary incontinence, urinary frequency, nocturnal urination.  Endo: Negative for unusual weight change.    Physical Examination:   There were no vitals taken for this visit.  General: Well-nourished, well-developed in no acute distress.  Eyes: No icterus. Conjunctivae pink. Mouth: Oropharyngeal mucosa moist and pink , no  lesions erythema or exudate. Extremities: No lower extremity edema. No clubbing or deformities. Neuro: Alert and oriented x 3.  Grossly intact. Skin: Evidence of hidra adenitis suppurativa features noted in her arms Psych: Alert and cooperative, normal mood and affect.   Imaging Studies: No results found.  Assessment and Plan:   Tiffany Rosales  is a 65 y.o. 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 Clinically appears to be in remission at this time clinically, endoscopically. Updated on need for regular health maintenance.   03/20/2019 DEXA scan shows osteopenia   Plan  1. DEXA scan to be repeated for osteopenia 2. No NSAID's 3.  I will refer her back to endocrinology since she did not schedule a follow-up although advised to do so for the osteopenia 4. Continue weekly humira. 5.  Check vitamin D, TB QuantiFERON, CBC CMP, hepatitis A, hepatitis B serologies to determine his immunity 6  Repeat colonoscopy in 2 years 7.  Last pneumococcal vaccine received in 2008 will need repeat we will administer in clinic today 8.  Skin exam refer to dermatology and she can discuss about the HIDRADENITIS SUPPURATIVA noted in her armpits which appears to be mild which should also respond to the Humira.   Dr Jonathon Bellows  MD,MRCP [facility]) Follow up in 6 months

## 2021-04-11 LAB — VARICELLA ZOSTER ANTIBODY, IGG: Varicella zoster IgG: 1306 index (ref >165)

## 2021-04-15 NOTE — Progress Notes (Signed)
Needs hep A/ B vaccine

## 2021-04-16 ENCOUNTER — Telehealth: Payer: Self-pay

## 2021-04-16 NOTE — Telephone Encounter (Signed)
-----   Message from Jonathon Bellows, MD sent at 04/15/2021  9:56 AM EST ----- Needs hep A/ B vaccine

## 2021-04-16 NOTE — Telephone Encounter (Signed)
Called patient to let her know that Dr. Vicente Males wanted her to know that she is needed the Hepatitis A and B vaccine. Patient stated that she will get it.

## 2021-04-18 LAB — COMPREHENSIVE METABOLIC PANEL
ALT: 16 IU/L (ref 0–32)
AST: 17 IU/L (ref 0–40)
Albumin/Globulin Ratio: 1.3 (ref 1.2–2.2)
Albumin: 4.3 g/dL (ref 3.8–4.8)
Alkaline Phosphatase: 141 IU/L — ABNORMAL HIGH (ref 44–121)
BUN/Creatinine Ratio: 17 (ref 12–28)
BUN: 13 mg/dL (ref 8–27)
Bilirubin Total: 0.4 mg/dL (ref 0.0–1.2)
CO2: 21 mmol/L (ref 20–29)
Calcium: 10.9 mg/dL — ABNORMAL HIGH (ref 8.7–10.3)
Chloride: 108 mmol/L — ABNORMAL HIGH (ref 96–106)
Creatinine, Ser: 0.78 mg/dL (ref 0.57–1.00)
Globulin, Total: 3.2 g/dL (ref 1.5–4.5)
Glucose: 75 mg/dL (ref 70–99)
Potassium: 4.4 mmol/L (ref 3.5–5.2)
Sodium: 140 mmol/L (ref 134–144)
Total Protein: 7.5 g/dL (ref 6.0–8.5)
eGFR: 85 mL/min/{1.73_m2} (ref >59)

## 2021-04-18 LAB — CBC WITH DIFFERENTIAL/PLATELET
Basophils Absolute: 0.1 10*3/uL (ref 0.0–0.2)
Basos: 1 %
EOS (ABSOLUTE): 0.3 10*3/uL (ref 0.0–0.4)
Eos: 3 %
Hematocrit: 43.2 % (ref 34.0–46.6)
Hemoglobin: 14.2 g/dL (ref 11.1–15.9)
Immature Grans (Abs): 0.1 10*3/uL (ref 0.0–0.1)
Immature Granulocytes: 1 %
Lymphocytes Absolute: 3.2 10*3/uL — ABNORMAL HIGH (ref 0.7–3.1)
Lymphs: 34 %
MCH: 27.4 pg (ref 26.6–33.0)
MCHC: 32.9 g/dL (ref 31.5–35.7)
MCV: 83 fL (ref 79–97)
Monocytes Absolute: 0.6 10*3/uL (ref 0.1–0.9)
Monocytes: 6 %
Neutrophils Absolute: 5.1 10*3/uL (ref 1.4–7.0)
Neutrophils: 55 %
Platelets: 174 10*3/uL (ref 150–450)
RBC: 5.18 x10E6/uL (ref 3.77–5.28)
RDW: 13 % (ref 11.7–15.4)
WBC: 9.4 10*3/uL (ref 3.4–10.8)

## 2021-04-18 LAB — VITAMIN D 1,25 DIHYDROXY
Vitamin D 1, 25 (OH)2 Total: 110 pg/mL — ABNORMAL HIGH
Vitamin D2 1, 25 (OH)2: <10 pg/mL
Vitamin D3 1, 25 (OH)2: 107 pg/mL

## 2021-04-18 LAB — QUANTIFERON-TB GOLD PLUS
QuantiFERON Mitogen Value: >10 IU/mL
QuantiFERON Nil Value: 0.06 IU/mL
QuantiFERON TB1 Ag Value: 0.05 IU/mL
QuantiFERON TB2 Ag Value: 0.04 IU/mL
QuantiFERON-TB Gold Plus: NEGATIVE

## 2021-04-18 LAB — HEPATITIS B SURFACE ANTIGEN: Hepatitis B Surface Ag: NEGATIVE

## 2021-04-18 LAB — C-REACTIVE PROTEIN: CRP: 9 mg/L (ref 0–10)

## 2021-04-18 LAB — HEPATITIS A ANTIBODY, TOTAL: hep A Total Ab: NEGATIVE

## 2021-04-18 LAB — HEPATITIS C ANTIBODY: Hep C Virus Ab: <0.1 s/co ratio (ref 0.0–0.9)

## 2021-04-18 LAB — HEPATITIS B SURFACE ANTIBODY,QUALITATIVE: Hep B Surface Ab, Qual: NONREACTIVE

## 2021-06-12 ENCOUNTER — Ambulatory Visit
Admission: RE | Admit: 2021-06-12 | Discharge: 2021-06-12 | Disposition: A | Discharge status: Home / Self Care | Payer: Medicare HMO | Source: Ambulatory Visit | Attending: Gastroenterology | Admitting: Gastroenterology

## 2021-06-12 ENCOUNTER — Other Ambulatory Visit: Payer: Self-pay

## 2021-06-12 DIAGNOSIS — Z13820 Encounter for screening for osteoporosis: Secondary | ICD-10-CM | POA: Diagnosis not present

## 2021-06-12 DIAGNOSIS — M8589 Other specified disorders of bone density and structure, multiple sites: Secondary | ICD-10-CM | POA: Diagnosis not present

## 2021-06-12 DIAGNOSIS — K5100 Ulcerative (chronic) pancolitis without complications: Secondary | ICD-10-CM | POA: Diagnosis present

## 2021-06-12 DIAGNOSIS — Z780 Asymptomatic menopausal state: Secondary | ICD-10-CM | POA: Insufficient documentation

## 2021-06-17 NOTE — Progress Notes (Signed)
Inform has osteopenia needs to follow-up with endocrinology as we referred previously.  Continues to use calcium and vitamin D supplementation

## 2021-06-19 ENCOUNTER — Telehealth: Payer: Self-pay

## 2021-06-19 NOTE — Telephone Encounter (Signed)
Patient verbalized understanding of results  

## 2021-06-19 NOTE — Telephone Encounter (Signed)
-----   Message from Jonathon Bellows, MD sent at 06/17/2021  8:11 AM EDT ----- ?Inform has osteopenia needs to follow-up with endocrinology as we referred previously.  Continues to use calcium and vitamin D supplementation ?

## 2021-09-19 ENCOUNTER — Ambulatory Visit: Payer: Medicare HMO | Admitting: Dermatology

## 2021-10-08 ENCOUNTER — Ambulatory Visit: Payer: Medicare HMO | Admitting: Gastroenterology

## 2021-10-08 ENCOUNTER — Encounter: Payer: Self-pay | Admitting: Gastroenterology

## 2021-10-08 VITALS — BP 131/86 | HR 108 | Temp 97.6°F | Wt 252.8 lb

## 2021-10-08 DIAGNOSIS — M8580 Other specified disorders of bone density and structure, unspecified site: Secondary | ICD-10-CM | POA: Diagnosis not present

## 2021-10-08 DIAGNOSIS — K5100 Ulcerative (chronic) pancolitis without complications: Secondary | ICD-10-CM | POA: Diagnosis not present

## 2021-10-08 DIAGNOSIS — Z23 Encounter for immunization: Secondary | ICD-10-CM | POA: Diagnosis not present

## 2021-10-08 NOTE — Patient Instructions (Addendum)
Please let your primary care doctor draw your CBC, CMP and CRP. Please fax results to [phone] attention: Dr. Jonathon Bellows. Thank you.  Please return in one month for your last Hepatitis A and B vaccine.

## 2021-10-08 NOTE — Progress Notes (Signed)
Jonathon Bellows MD, MRCP(U.K) [address]  [place]  [place], [place] [address]  Main: [phone]  Fax: [phone]   Primary Care Physician: Barrett Henle, MD  Primary Gastroenterologist:  Dr. Jonathon Bellows   Chief Complaint  Patient presents with   Ulcerative Colitis    HPI: Tiffany Rosales is a 66 y.o. female Summary of history :   Been followed by myself since 2018 for pan ulcerative colitis [diagnosed in February 2016 at [facility] on Humira, osteopenia, vitamin D deficiency. Previously tried treatment with ASA and failed. Then was placed on Imuran but was stopped due to Dizziness and headaches. She was subsequently started on Humira , appears was started around 10/2015. Treated with prednisone during flares.  Colonoscopy 07/2015 - moderately active pan colitis upto hepatic flexure.    01/30/17: MRE: no evidence of active inflammation of small or large bowel  Colonoscopy 05/2017 - TI bx- normal, Rt colon bx - normal , 5 polyps x adenomas excised . No colitis in Tx colon , left clon ,sigmoid colon , rectum   July 2019: localized mild inflammation seen at the sigmoid colon with rest of the left colon appearing normal. Bx showed moderate active colitis- no HSV/CMV.   03/22/2019 DEXA scan T score of -2.1.She was seen by endocrinology and 12/19/2019 for osteopenia advised to continue daily supplementation of calcium and vitamin D plan was to see her back in 4 to 6 weeks after she did not follow-up 04/05/2021: Colonoscopy: 04/05/2021: Colonoscopy: 4 to 6 mm size polyps were seen in the transverse colon cecum resected with cold snare.  Localized area of mildly erythematous mucosa seen in the proximal descending colon biopsied. The polyps were tubular adenoma and sessile serrated polyp.  No evidence of colitis was seen endoscopically or on biopsies.   Takes Humira every week, no rectal bleeding.Fees well no other complaints.     Interval history 04/10/2021-10/08/2021   04/10/2021: Hemoglobin  14.2 g CRP 9 TB QuantiFERON negative not immune to hepatitis a and B- hepatitis C 06/12/2021 DEXA bone scan shows features of osteopenia: Referred to endocrinology continue to use vitamin D and calcium    She is doing well no new complaints no abdominal pain no diarrhea.  Has 2 bowel movements a day no blood in the stool takes Humira every week.  No NSAID use.  She canceled her dermatology appointment since her skin rash has resolved has been following with endocrinology.  Unclear if she got her vaccination.  Current Outpatient Medications  Medication Sig Dispense Refill   Adalimumab (HUMIRA PEN) 40 MG/0.4ML PNKT Inject 40 mg into the skin once a week. 12 each 3   lisinopril (PRINIVIL,ZESTRIL) 10 MG tablet      oxyCODONE-acetaminophen (PERCOCET) 7.5-325 MG tablet every 6 (six) hours as needed.      Acetaminophen 500 MG coapsule  (Patient not taking: Reported on 10/08/2021)     Cholecalciferol 25 MCG (1000 UT) tablet Take 1 tablet by mouth daily. (Patient not taking: Reported on 10/08/2021)     Multiple Vitamins-Minerals (MULTIVITAMIN WITH MINERALS) tablet Take 1 tablet by mouth daily. (Patient not taking: Reported on 10/08/2021)     No current facility-administered medications for this visit.    Allergies as of 10/08/2021 - Review Complete 10/08/2021  Allergen Reaction Noted   Other Other (See Comments) 07/02/2011    ROS:  General: Negative for anorexia, weight loss, fever, chills, fatigue, weakness. ENT: Negative for hoarseness, difficulty swallowing , nasal congestion. CV: Negative for  chest pain, angina, palpitations, dyspnea on exertion, peripheral edema.  Respiratory: Negative for dyspnea at rest, dyspnea on exertion, cough, sputum, wheezing.  GI: See history of present illness. GU:  Negative for dysuria, hematuria, urinary incontinence, urinary frequency, nocturnal urination.  Endo: Negative for unusual weight change.    Physical Examination:   BP 131/86   Pulse (!) 108   Temp  97.6 F (36.4 C) (Oral)   Wt 252 lb 12.8 oz (114.7 kg)   BMI 39.59 kg/m   General: Well-nourished, well-developed in no acute distress.  Eyes: No icterus. Conjunctivae pink. Mouth: Oropharyngeal mucosa moist and pink , no lesions erythema or exudate. Neuro: Alert and oriented x 3.  Grossly intact. Skin: Warm and dry, no jaundice.   Psych: Alert and cooperative, normal mood and affect.   Imaging Studies: No results found.  Assessment and Plan:   Tiffany Rosales is a 66 y.o. 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 Clinically appears to be in remission at this time clinically, endoscopically. Updated on need for regular health maintenance.   03/20/2019 DEXA scan shows osteopenia   Plan  1.  Osteopenia follow-up with endocrinology 2. No NSAID's 3.  Continue weekly humira. 4.  Repeat colonoscopy in 2 years 5.  Has had 1 out of 3 shots for hepatitis A and B will continue the series today 6.  She requires an annual skin exam reminded of the same she is not keen on a dermatology appointment at this time 7.  Check CBC CMP CRP   Dr Jonathon Bellows  MD,MRCP [facility]) Follow up in 6 months

## 2021-11-08 ENCOUNTER — Telehealth: Payer: Self-pay

## 2021-11-08 NOTE — Telephone Encounter (Signed)
Pt has asked to cancel her nurse visit for hep a/b. Scheduled for 11/11/21  She will be in [place] for a reunion.  She said she will have Dr. Pincus Large to administer her hep a/b vaccine when she goes for her appt next month.  I've asked her to call back to let us know when she has had it done.  Thanks, [place], [place]

## 2021-11-11 ENCOUNTER — Ambulatory Visit: Payer: Medicare HMO

## 2022-04-21 ENCOUNTER — Other Ambulatory Visit: Payer: Self-pay

## 2022-04-22 ENCOUNTER — Ambulatory Visit: Payer: Medicare HMO | Admitting: Gastroenterology

## 2022-04-22 ENCOUNTER — Encounter: Payer: Self-pay | Admitting: Gastroenterology

## 2022-04-22 VITALS — BP 143/87 | HR 105 | Temp 98.7°F | Wt 255.0 lb

## 2022-04-22 DIAGNOSIS — K5100 Ulcerative (chronic) pancolitis without complications: Secondary | ICD-10-CM

## 2022-04-22 DIAGNOSIS — Z23 Encounter for immunization: Secondary | ICD-10-CM

## 2022-04-22 NOTE — Patient Instructions (Signed)
You have completed you Hepatitis A and B vaccines! So you are good!  Take care and I'll see you in 6 months! :-)

## 2022-04-22 NOTE — Progress Notes (Signed)
Tiffany Bellows MD, MRCP(U.K) [address]  [place]  [place], [place] [address]  Main: [phone]  Fax: [phone]   Primary Care Physician: Barrett Henle, MD  Primary Gastroenterologist:  Dr. Jonathon Rosales   Chief Complaint  Patient presents with   ulcerative pancolitis    HPI: Tiffany Rosales is a 67 y.o. female Summary of history :   Been followed by myself since 2018 for pan ulcerative colitis [diagnosed in February 2016 at [facility] on Humira, osteopenia, vitamin D deficiency. Previously tried treatment with ASA and failed. Then was placed on Imuran but was stopped due to Dizziness and headaches. She was subsequently started on Humira , appears was started around 10/2015. Treated with prednisone during flares.  Colonoscopy 07/2015 - moderately active pan colitis upto hepatic flexure.    01/30/17: MRE: no evidence of active inflammation of small or large bowel  03/22/2019 DEXA scan T score of -2.1.She was seen by endocrinology and 12/19/2019 for osteopenia advised to continue daily supplementation of calcium and vitamin D plan was to see her back in 4 to 6 weeks after she did not follow-up 04/05/2021: Colonoscopy: 04/05/2021: Colonoscopy: 4 to 6 mm size polyps were seen in the transverse colon cecum resected with cold snare.  Localized area of mildly erythematous mucosa seen in the proximal descending colon biopsied. The polyps were tubular adenoma and sessile serrated polyp.  No evidence of colitis was seen endoscopically or on biopsies. 04/10/2021: Hemoglobin 14.2 g CRP 9 TB QuantiFERON negative not immune to hepatitis a and B- hepatitis C 06/12/2021 DEXA bone scan shows features of osteopenia: Referred to endocrinology continue to use vitamin D and calcium   Takes Humira every week, no rectal bleeding.Fees well no other complaints.     Interval history 10/08/2021-04/22/2022     11/2021: HB 14, crp  6, CMP  normal.   Denies any abdominal pain denies any diarrhea 1 bowel movement  per day.  Does not awaken in the night for a bowel movement on Humira.  Denies any NSAID use.       Current Outpatient Medications  Medication Sig Dispense Refill   Acetaminophen 500 MG coapsule      Adalimumab (HUMIRA PEN) 40 MG/0.4ML PNKT Inject 40 mg into the skin once a week. 12 each 3   lisinopril (PRINIVIL,ZESTRIL) 10 MG tablet      oxyCODONE-acetaminophen (PERCOCET) 7.5-325 MG tablet every 6 (six) hours as needed.      No current facility-administered medications for this visit.    Allergies as of 04/22/2022 - Review Complete 04/22/2022  Allergen Reaction Noted   Other Other (See Comments) 07/02/2011    ROS:  General: Negative for anorexia, weight loss, fever, chills, fatigue, weakness. ENT: Negative for hoarseness, difficulty swallowing , nasal congestion. CV: Negative for chest pain, angina, palpitations, dyspnea on exertion, peripheral edema.  Respiratory: Negative for dyspnea at rest, dyspnea on exertion, cough, sputum, wheezing.  GI: See history of present illness. GU:  Negative for dysuria, hematuria, urinary incontinence, urinary frequency, nocturnal urination.  Endo: Negative for unusual weight change.    Physical Examination:   BP (!) 143/87   Pulse (!) 105   Temp 98.7 F (37.1 C) (Oral)   Wt 255 lb (115.7 kg)   BMI 39.94 kg/m   General: Well-nourished, well-developed in no acute distress.  Eyes: No icterus. Conjunctivae pink. Mouth: Oropharyngeal mucosa moist and pink , no lesions erythema or exudate. Neuro: Alert and oriented x 3.  Grossly intact. Skin:  Warm and dry, no jaundice.   Psych: Alert and cooperative, normal mood and affect.   Imaging Studies: No results found.  Assessment and Plan:   [name] is a 67 y.o. 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 Clinically appears to be in remission at this time clinically,  endoscopically. Updated on need for regular health maintenance.   03/20/2019 DEXA scan shows osteopenia.  Explained about the need to regular follow-up while on Humira also explained that sometimes the medication is stopped working to call us right away when she develops symptoms and we can change it to alternative options   Plan  1.  Osteopenia follow-up with endocrinology 2.  No NSAID's 3.  Continue weekly humira. 4.  Repeat colonoscopy in 2 years January 2025 5.  Has had 2 out of 3 shots for hepatitis A and B will continue the series today 6.   Check CBC CMP CRP, vitamin D    Dr Tiffany Bellows  MD,MRCP [facility]) Follow up in 6 months

## 2022-04-28 LAB — CBC WITH DIFFERENTIAL/PLATELET
Basophils Absolute: 0.1 10*3/uL (ref 0.0–0.2)
Basos: 1 %
EOS (ABSOLUTE): 0.1 10*3/uL (ref 0.0–0.4)
Eos: 2 %
Hematocrit: 44.5 % (ref 34.0–46.6)
Hemoglobin: 14 g/dL (ref 11.1–15.9)
Immature Grans (Abs): 0 10*3/uL (ref 0.0–0.1)
Immature Granulocytes: 0 %
Lymphocytes Absolute: 3.1 10*3/uL (ref 0.7–3.1)
Lymphs: 39 %
MCH: 26.6 pg (ref 26.6–33.0)
MCHC: 31.5 g/dL (ref 31.5–35.7)
MCV: 85 fL (ref 79–97)
Monocytes Absolute: 0.6 10*3/uL (ref 0.1–0.9)
Monocytes: 7 %
Neutrophils Absolute: 4.1 10*3/uL (ref 1.4–7.0)
Neutrophils: 51 %
Platelets: 165 10*3/uL (ref 150–450)
RBC: 5.26 x10E6/uL (ref 3.77–5.28)
RDW: 13.1 % (ref 11.7–15.4)
WBC: 8 10*3/uL (ref 3.4–10.8)

## 2022-04-28 LAB — QUANTIFERON-TB GOLD PLUS
QuantiFERON Mitogen Value: >10 IU/mL
QuantiFERON Nil Value: 0.02 IU/mL
QuantiFERON TB1 Ag Value: 0.03 IU/mL
QuantiFERON TB2 Ag Value: 0.04 IU/mL
QuantiFERON-TB Gold Plus: NEGATIVE

## 2022-04-28 LAB — C-REACTIVE PROTEIN: CRP: 4 mg/L (ref 0–10)

## 2022-04-28 LAB — VITAMIN D 1,25 DIHYDROXY
Vitamin D 1, 25 (OH)2 Total: 126 pg/mL — ABNORMAL HIGH
Vitamin D2 1, 25 (OH)2: <10 pg/mL
Vitamin D3 1, 25 (OH)2: 121 pg/mL

## 2022-05-02 ENCOUNTER — Telehealth: Payer: Self-pay | Admitting: Gastroenterology

## 2022-05-02 NOTE — Telephone Encounter (Signed)
Patient called stating that she takes humira and is wondering if it is okay to go and get her pfizer shot. I spoke with Herb Grays and she stated that it would be fine. I informed the patient.

## 2022-05-05 ENCOUNTER — Other Ambulatory Visit: Payer: Self-pay | Admitting: Sports Medicine

## 2022-05-05 DIAGNOSIS — Z1231 Encounter for screening mammogram for malignant neoplasm of breast: Secondary | ICD-10-CM

## 2022-05-23 ENCOUNTER — Ambulatory Visit
Admission: RE | Admit: 2022-05-23 | Discharge: 2022-05-23 | Disposition: A | Discharge status: Home / Self Care | Payer: Medicare HMO | Source: Ambulatory Visit | Attending: Sports Medicine | Admitting: Sports Medicine

## 2022-05-23 DIAGNOSIS — Z1231 Encounter for screening mammogram for malignant neoplasm of breast: Secondary | ICD-10-CM

## 2022-05-28 DIAGNOSIS — F1190 Opioid use, unspecified, uncomplicated: Secondary | ICD-10-CM | POA: Insufficient documentation

## 2022-12-18 ENCOUNTER — Ambulatory Visit: Payer: Medicare HMO | Admitting: Gastroenterology

## 2023-02-05 ENCOUNTER — Ambulatory Visit: Payer: Medicare HMO | Admitting: Gastroenterology

## 2023-02-05 ENCOUNTER — Encounter: Payer: Self-pay | Admitting: Gastroenterology

## 2023-02-05 VITALS — BP 118/92 | HR 115 | Temp 98.7°F | Wt 250.0 lb

## 2023-02-05 DIAGNOSIS — K5100 Ulcerative (chronic) pancolitis without complications: Secondary | ICD-10-CM

## 2023-02-05 DIAGNOSIS — M8580 Other specified disorders of bone density and structure, unspecified site: Secondary | ICD-10-CM | POA: Diagnosis not present

## 2023-02-05 NOTE — Progress Notes (Signed)
Wyline Mood MD, MRCP(U.K) [address]  [address]  [place], [place] [address]  Main: [phone]  Fax: [phone]   Primary Care Physician: Rolm Bookbinder, MD  Primary Gastroenterologist:  Dr. Wyline Mood   Chief Complaint  Patient presents with   ulcerative pancolitis    HPI: Tiffany Rosales is a 67 y.o. 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 Previously tried treatment with ASA and failed. Then was placed on Imuran but was stopped due to Dizziness and headaches. She was subsequently started on Humira , appears was started around 10/2015. Treated with prednisone during flares.  Colonoscopy 07/2015 - moderately active pan colitis upto hepatic flexure.    01/30/17: MRE: no evidence of active inflammation of small or large bowel  03/22/2019 DEXA scan T score of -2.1.She was seen by endocrinology and 12/19/2019 for osteopenia advised to continue daily supplementation of calcium and vitamin D plan was to see her back in 4 to 6 weeks after she did not follow-up 04/05/2021: Colonoscopy:4 to 6 mm size polyps were seen in the transverse colon cecum resected with cold snare.  Localized area of mildly erythematous mucosa seen in the proximal descending colon biopsied. The polyps were tubular adenoma and sessile serrated polyp.  No evidence of colitis was seen endoscopically or on biopsies. 04/10/2021: Hemoglobin 14.2 g CRP 9 TB QuantiFERON negative not immune to hepatitis a and B- hepatitis C 06/12/2021 DEXA bone scan shows features of osteopenia: Referred to endocrinology continue to use vitamin D and calcium   Takes Humira every week, no rectal bleeding.Fees well no other complaints.     Interval history 04/22/2022-02/05/2023  No new complaints.  Denies any change in bowel movements.  Formed stools daily.  No rectal bleeding.  No diarrhea.  No NSAID use.   On Humira which she takes every Friday.  No recent follow-up for osteopenia never had a skin exam recently      Current Outpatient Medications  Medication Sig Dispense Refill   Acetaminophen 500 MG coapsule      Adalimumab (HUMIRA PEN) 40 MG/0.4ML PNKT Inject 40 mg into the skin once a week. 12 each 3   lisinopril (PRINIVIL,ZESTRIL) 10 MG tablet      oxyCODONE-acetaminophen (PERCOCET) 7.5-325 MG tablet every 6 (six) hours as needed.      No current facility-administered medications for this visit.    Allergies as of 02/05/2023 - Review Complete 02/05/2023  Allergen Reaction Noted   Other Other (See Comments) 07/02/2011     ROS:  General: Negative for anorexia, weight loss, fever, chills, fatigue, weakness. ENT: Negative for hoarseness, difficulty swallowing , nasal congestion. CV: Negative for chest pain, angina, palpitations, dyspnea on exertion, peripheral edema.  Respiratory: Negative for dyspnea at rest, dyspnea on exertion, cough, sputum, wheezing.  GI: See history of present illness. GU:  Negative for dysuria, hematuria, urinary incontinence, urinary frequency, nocturnal urination.  Endo: Negative for unusual weight change.    Physical Examination:   BP (!) 118/92   Pulse (!) 115   Temp 98.7 F (37.1 C) (Oral)   Wt 250 lb (113.4 kg)   BMI 39.16 kg/m   General: Well-nourished, well-developed in no acute distress.  Eyes: No icterus. Conjunctivae pink. Neuro: Alert and oriented x 3.  Grossly intact. Skin: Warm and dry, no jaundice.   Psych: Alert and cooperative, normal mood and  affect.   Imaging Studies: No results found.  Assessment and Plan:   Tiffany Rosales is a 67 y.o. y/o female  here to follow up for  ulcerative pan colitis diagnosed in early 67 , failed ASA,Imuran and now on weekly Humira since 10/2015 every week  . Clinically appears to be in remission at this time clinically, endoscopically. Updated on need for regular health maintenance.  05/3021  DEXA scan shows osteopenia.  Explained about the need to regular follow-up while on Humira also explained that sometimes the medication is stopped working to call us right away when she develops symptoms and we can change it to alternative options. S/p Hep A/B , vaccine , received pneumococcal vaccine    Plan  1.  Osteopenia follow-up with endocrinology 2.  No NSAID's 3.  Continue weekly humira. 4.  Repeat colonoscopy in 2 years January 2025 5.  Has had 2 out of 3 shots for hepatitis A and B will continue the series today 6.  Check CBC CMP CRP, vitamin D, fecal calprotectin, Hep A/B serology post vaccination  7. Needs skin exam , Shingrix vaccine at outside pharmacy    Dr Wyline Mood  MD,MRCP [facility]) Follow up in 6 months

## 2023-02-05 NOTE — Addendum Note (Signed)
Addended by: Adela Ports on: 02/05/2023 01:55 PM   Modules accepted: Orders

## 2023-02-05 NOTE — Addendum Note (Signed)
Addended by: Adela Ports on: 02/05/2023 01:42 PM   Modules accepted: Orders

## 2023-02-05 NOTE — Addendum Note (Signed)
Addended by: Adela Ports on: 02/05/2023 01:46 PM   Modules accepted: Orders

## 2023-02-06 ENCOUNTER — Other Ambulatory Visit
Admission: RE | Admit: 2023-02-06 | Discharge: 2023-02-06 | Disposition: A | Discharge status: Home / Self Care | Payer: Medicare HMO | Source: Ambulatory Visit | Attending: Gastroenterology | Admitting: Gastroenterology

## 2023-02-06 DIAGNOSIS — K5100 Ulcerative (chronic) pancolitis without complications: Secondary | ICD-10-CM | POA: Diagnosis present

## 2023-02-11 ENCOUNTER — Telehealth: Payer: Self-pay

## 2023-02-11 DIAGNOSIS — R748 Abnormal levels of other serum enzymes: Secondary | ICD-10-CM

## 2023-02-11 LAB — CALPROTECTIN, FECAL: Calprotectin, Fecal: 132 ug/g — ABNORMAL HIGH (ref 0–120)

## 2023-02-11 NOTE — Telephone Encounter (Addendum)
Called patient's nephew-Josh and let him know what about her stool test and blood work results and Dr. Johnney Killian recommendations. Sharia Reeve stated that he would bring his aunt next Tuesday and that he would also bring his aunt in 8 weeks to get her stool retested (calprotectin, fecal). Josh understood everything and had no further questions and stated that he would call his aunt to explain things in [place] terms.

## 2023-02-11 NOTE — Telephone Encounter (Signed)
Called patient to let her know that her alkaline phosphatase was elevated and that Dr. Tobi Bastos wanted to add an additional lab test (GGT). Therefore, I let her know that I had called [facility] and asked if could get added to the existing labs. However, I was told 2 days later that there was not sufficient blood left to add the GGT. So, I asked the patient if she could come and have her lab drawn. Patient agreed but wanted me to call her caregiver-Josh (nephew) to let him know. Patient also had a question about her stool test results. I told her that I would have to notify Dr. Tobi Bastos in reference to the result and that I would also let Josh know about the results once I got a response from Dr. Tobi Bastos. Patient agreed and had no further questions.

## 2023-02-18 LAB — GAMMA GT: GGT: 22 [IU]/L (ref 0–60)

## 2023-02-19 LAB — CBC WITH DIFFERENTIAL/PLATELET
Basophils Absolute: 0.1 10*3/uL (ref 0.0–0.2)
Basos: 1 %
EOS (ABSOLUTE): 0.1 10*3/uL (ref 0.0–0.4)
Eos: 2 %
Hematocrit: 43.8 % (ref 34.0–46.6)
Hemoglobin: 13.8 g/dL (ref 11.1–15.9)
Immature Grans (Abs): 0 10*3/uL (ref 0.0–0.1)
Immature Granulocytes: 0 %
Lymphocytes Absolute: 3.3 10*3/uL — ABNORMAL HIGH (ref 0.7–3.1)
Lymphs: 39 %
MCH: 26.9 pg (ref 26.6–33.0)
MCHC: 31.5 g/dL (ref 31.5–35.7)
MCV: 85 fL (ref 79–97)
Monocytes Absolute: 0.5 10*3/uL (ref 0.1–0.9)
Monocytes: 6 %
Neutrophils Absolute: 4.4 10*3/uL (ref 1.4–7.0)
Neutrophils: 52 %
Platelets: 183 10*3/uL (ref 150–450)
RBC: 5.13 x10E6/uL (ref 3.77–5.28)
RDW: 13.8 % (ref 11.7–15.4)
WBC: 8.4 10*3/uL (ref 3.4–10.8)

## 2023-02-19 LAB — HEPATITIS B SURFACE ANTIBODY,QUALITATIVE: Hep B Surface Ab, Qual: NONREACTIVE

## 2023-02-19 LAB — COMPREHENSIVE METABOLIC PANEL
ALT: 15 [IU]/L (ref 0–32)
AST: 19 [IU]/L (ref 0–40)
Albumin: 4 g/dL (ref 3.9–4.9)
Alkaline Phosphatase: 142 [IU]/L — ABNORMAL HIGH (ref 44–121)
BUN/Creatinine Ratio: 11 — ABNORMAL LOW (ref 12–28)
BUN: 11 mg/dL (ref 8–27)
Bilirubin Total: 0.6 mg/dL (ref 0.0–1.2)
CO2: 20 mmol/L (ref 20–29)
Calcium: 10.6 mg/dL — ABNORMAL HIGH (ref 8.7–10.3)
Chloride: 109 mmol/L — ABNORMAL HIGH (ref 96–106)
Creatinine, Ser: 0.97 mg/dL (ref 0.57–1.00)
Globulin, Total: 3.3 g/dL (ref 1.5–4.5)
Glucose: 72 mg/dL (ref 70–99)
Potassium: 4.4 mmol/L (ref 3.5–5.2)
Sodium: 141 mmol/L (ref 134–144)
Total Protein: 7.3 g/dL (ref 6.0–8.5)
eGFR: 64 mL/min/{1.73_m2} (ref >59)

## 2023-02-19 LAB — VITAMIN D 1,25 DIHYDROXY
Vitamin D 1, 25 (OH)2 Total: 96 pg/mL — ABNORMAL HIGH
Vitamin D2 1, 25 (OH)2: <10 pg/mL
Vitamin D3 1, 25 (OH)2: 96 pg/mL

## 2023-02-19 LAB — QUANTIFERON-TB GOLD PLUS
QuantiFERON Mitogen Value: >10 [IU]/mL
QuantiFERON Nil Value: 0.02 [IU]/mL
QuantiFERON TB1 Ag Value: 0.03 [IU]/mL
QuantiFERON TB2 Ag Value: 0.04 [IU]/mL
QuantiFERON-TB Gold Plus: NEGATIVE

## 2023-02-19 LAB — C-REACTIVE PROTEIN: CRP: 6 mg/L (ref 0–10)

## 2023-02-19 LAB — HEPATITIS A ANTIBODY, TOTAL: hep A Total Ab: POSITIVE — AB

## 2023-02-20 ENCOUNTER — Telehealth: Payer: Self-pay

## 2023-02-20 NOTE — Telephone Encounter (Signed)
Called patient to let her know of her lab results and patient was happy to hear. Patient had no further questions.

## 2023-02-20 NOTE — Telephone Encounter (Signed)
-----   Message from Wyline Mood sent at 02/19/2023 12:37 PM EST ----- GGT normal which means mild elevation of alkaline phosphotase not related to liver

## 2023-02-24 ENCOUNTER — Telehealth: Payer: Self-pay

## 2023-02-24 NOTE — Telephone Encounter (Signed)
Patient dropped off filled out patient assistance forms for Humira. Filled out the provider portion and faxed forms to [facility] patient assistance. Waiting on there response will they review to see if she has been approved for 2025.

## 2023-02-25 ENCOUNTER — Telehealth: Payer: Self-pay

## 2023-02-25 NOTE — Telephone Encounter (Signed)
Patient verbalized understanding of results  

## 2023-02-25 NOTE — Telephone Encounter (Signed)
-----   Message from Wyline Mood sent at 02/24/2023 11:27 AM EST ----- GGT is normal hence elevated alkaline phosphatase is not related to the liver

## 2023-05-05 ENCOUNTER — Ambulatory Visit: Payer: Medicare HMO | Admitting: Dermatology

## 2023-05-15 ENCOUNTER — Telehealth: Payer: Self-pay

## 2023-05-15 NOTE — Telephone Encounter (Signed)
Patient called stating that her Humira had been placed on hold due to Medicare Extra Help Program. Therefore, [facility] Assistance is not able to send patient medication until she applies for the medicare extra help, which she has and she wanted Dr. Tobi Bastos know that she didn't know what to do since she would not be able to afford it and this is her last injection and was afraid for a flare-up. I told her that it was unfortunate that she had to go through this but she had to wait for medicare extra help to approve or deny. Once Medicare Extra Help gives her a denial letter, then she is able to apply for [employer] Assistance and get approved but they need a denial first in order to help her. Patient stated that she would let us know in case there was anything else that was needed from [place] in order to proceed with her getting her medication.

## 2023-06-01 ENCOUNTER — Telehealth: Payer: Self-pay

## 2023-06-01 NOTE — Telephone Encounter (Signed)
 Patient called stating that [facility] Medicare denied her Humira even though she explained to them that she had been taking it since 2017 and it had helped her. But they just didn't care, she stated. However, now she is in a vine and wants to know what else she is able to do since Humira was denied. Patient stated that she had been starting to have some symptoms but no bleeding. Please advise.  Edsel Petrin Emergency Contact [phone] [facility]

## 2023-06-03 NOTE — Telephone Encounter (Signed)
 Called patient's nephew-Josh as requested by patient. I had to leave him a voicemail letting him know that Dr. Tobi Bastos would like for them to call [employer] and ask what medication is covered so we could send in a referral with hopes to get is accepted as her aunt is already having some symptoms. I left him my direct line to call.

## 2023-06-08 NOTE — Telephone Encounter (Signed)
 Patient called me back stating that [facility] told her that she did not have coverage for her humira. However, she stated that she was going to wait and see what they would tell her as in an alternate medication that could be covered. Patient stated that she would call us back once she has an answer for [place] so we could let us know.

## 2023-06-15 ENCOUNTER — Encounter: Payer: Self-pay | Admitting: Oncology

## 2023-06-25 ENCOUNTER — Ambulatory Visit: Payer: Medicare HMO | Admitting: Gastroenterology

## 2023-07-06 ENCOUNTER — Telehealth: Payer: Self-pay

## 2023-07-06 NOTE — Telephone Encounter (Signed)
 Patient called and left a message on main line and states she has questions about the Humira.

## 2023-07-07 NOTE — Telephone Encounter (Signed)
 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 The recommended HUMIRA dose regimen for adult patients with ulcerative colitis (UC) is 160 mg initially on Day 1 (given in one day or split over two consecutive days), followed by 80 mg two weeks later (Day 15). Two weeks later (Day 29) continue with a dose of 40 mg every other week. I then called Josh and explained what he is to do and he repeated what I had stated to make sure that the dosing was correct. Josh had no further questions.

## 2023-07-07 NOTE — Telephone Encounter (Signed)
 The recommended HUMIRA dose regimen for adult patients with ulcerative colitis (UC) is 160 mg initially on Day 1 (given in one day or split over two consecutive days), followed by 80 mg two weeks later (Day 15). Two weeks later (Day 29) continue with a dose of 40 mg every other week.

## 2023-07-07 NOTE — Telephone Encounter (Signed)
 Patient called wanting to know if she is to start all over again with her Humira since her last dosage was in January 2025. She was having difficulties with her insurance, therefore, until now that she was able to get her Humira shipped today. Please advise. Patient also wanted to know if she needed to schedule a follow up appointment with you now or wait until you at [facility]?

## 2024-04-28 ENCOUNTER — Encounter: Payer: Self-pay | Admitting: Oncology

## 2024-05-27 ENCOUNTER — Ambulatory Visit: Admit: 2024-05-27 | Admitting: Gastroenterology
